# Patient Record
Sex: Female | Born: 1980 | Race: White | Hispanic: No | Marital: Single | Smoking: Current every day smoker
Health system: Southern US, Community
[De-identification: ages and names within clinical notes are randomized; demographics above are authoritative.]

## PROBLEM LIST (undated history)

## (undated) DIAGNOSIS — I219 Acute myocardial infarction, unspecified: Secondary | ICD-10-CM

## (undated) DIAGNOSIS — Z87440 Personal history of urinary (tract) infections: Secondary | ICD-10-CM

## (undated) DIAGNOSIS — K259 Gastric ulcer, unspecified as acute or chronic, without hemorrhage or perforation: Secondary | ICD-10-CM

## (undated) DIAGNOSIS — J449 Chronic obstructive pulmonary disease, unspecified: Secondary | ICD-10-CM

## (undated) HISTORY — PX: NO PAST SURGERIES: SHX2092

---

## 2005-04-26 ENCOUNTER — Emergency Department (HOSPITAL_COMMUNITY): Admission: EM | Admit: 2005-04-26 | Discharge: 2005-04-26 | Payer: Self-pay | Admitting: Emergency Medicine

## 2007-04-08 ENCOUNTER — Emergency Department (HOSPITAL_COMMUNITY): Admission: EM | Admit: 2007-04-08 | Discharge: 2007-04-09 | Payer: Self-pay | Admitting: Emergency Medicine

## 2007-09-26 ENCOUNTER — Emergency Department (HOSPITAL_COMMUNITY): Admission: EM | Admit: 2007-09-26 | Discharge: 2007-09-26 | Payer: Self-pay | Admitting: Emergency Medicine

## 2012-07-27 ENCOUNTER — Emergency Department: Payer: Self-pay | Admitting: Emergency Medicine

## 2012-11-14 ENCOUNTER — Emergency Department: Payer: Self-pay | Admitting: Emergency Medicine

## 2012-11-16 ENCOUNTER — Ambulatory Visit: Payer: Self-pay | Admitting: Family Medicine

## 2012-11-27 ENCOUNTER — Emergency Department: Payer: Self-pay | Admitting: Internal Medicine

## 2012-12-07 ENCOUNTER — Emergency Department: Payer: Self-pay | Admitting: Emergency Medicine

## 2013-10-13 ENCOUNTER — Emergency Department: Payer: Self-pay | Admitting: Emergency Medicine

## 2013-10-13 LAB — COMPREHENSIVE METABOLIC PANEL
ALBUMIN: 4.4 g/dL (ref 3.4–5.0)
ANION GAP: 10 (ref 7–16)
Alkaline Phosphatase: 63 U/L
BILIRUBIN TOTAL: 0.5 mg/dL (ref 0.2–1.0)
BUN: 13 mg/dL (ref 7–18)
CALCIUM: 9.2 mg/dL (ref 8.5–10.1)
CHLORIDE: 110 mmol/L — AB (ref 98–107)
Co2: 21 mmol/L (ref 21–32)
Creatinine: 0.89 mg/dL (ref 0.60–1.30)
EGFR (African American): >60
Glucose: 163 mg/dL — ABNORMAL HIGH (ref 65–99)
Osmolality: 285 (ref 275–301)
Potassium: 3.8 mmol/L (ref 3.5–5.1)
SGOT(AST): 21 U/L (ref 15–37)
SGPT (ALT): 25 U/L
SODIUM: 141 mmol/L (ref 136–145)
Total Protein: 8.2 g/dL (ref 6.4–8.2)

## 2013-10-13 LAB — CBC WITH DIFFERENTIAL/PLATELET
Basophil #: 0 10*3/uL (ref 0.0–0.1)
Basophil %: 0.3 %
Eosinophil #: 0 10*3/uL (ref 0.0–0.7)
Eosinophil %: 0.1 %
HCT: 47.1 % — ABNORMAL HIGH (ref 35.0–47.0)
HGB: 15.7 g/dL (ref 12.0–16.0)
LYMPHS PCT: 4.8 %
Lymphocyte #: 0.9 10*3/uL — ABNORMAL LOW (ref 1.0–3.6)
MCH: 34.9 pg — AB (ref 26.0–34.0)
MCHC: 33.4 g/dL (ref 32.0–36.0)
MCV: 105 fL — AB (ref 80–100)
MONO ABS: 0.5 x10 3/mm (ref 0.2–0.9)
Monocyte %: 3 %
Neutrophil #: 16.8 10*3/uL — ABNORMAL HIGH (ref 1.4–6.5)
Neutrophil %: 91.8 %
Platelet: 341 10*3/uL (ref 150–440)
RBC: 4.5 10*6/uL (ref 3.80–5.20)
RDW: 12.9 % (ref 11.5–14.5)
WBC: 18.3 10*3/uL — ABNORMAL HIGH (ref 3.6–11.0)

## 2013-10-13 LAB — TROPONIN I

## 2013-10-13 LAB — LIPASE, BLOOD: Lipase: 93 U/L (ref 73–393)

## 2013-10-14 LAB — HCG, QUANTITATIVE, PREGNANCY

## 2013-10-23 ENCOUNTER — Inpatient Hospital Stay: Payer: Self-pay | Admitting: Internal Medicine

## 2013-10-23 LAB — CBC
HCT: 39.8 % (ref 35.0–47.0)
HGB: 13.2 g/dL (ref 12.0–16.0)
MCH: 34.2 pg — ABNORMAL HIGH (ref 26.0–34.0)
MCHC: 33.2 g/dL (ref 32.0–36.0)
MCV: 103 fL — AB (ref 80–100)
PLATELETS: 225 10*3/uL (ref 150–440)
RBC: 3.86 10*6/uL (ref 3.80–5.20)
RDW: 12.4 % (ref 11.5–14.5)
WBC: 13.5 10*3/uL — AB (ref 3.6–11.0)

## 2013-10-23 LAB — URINALYSIS, COMPLETE
BILIRUBIN, UR: NEGATIVE
Glucose,UR: NEGATIVE mg/dL (ref 0–75)
KETONE: NEGATIVE
Nitrite: POSITIVE
PH: 5 (ref 4.5–8.0)
RBC,UR: 5 /HPF (ref 0–5)
Specific Gravity: 1.014 (ref 1.003–1.030)
Squamous Epithelial: 1
WBC UR: 287 /HPF (ref 0–5)

## 2013-10-23 LAB — LIPASE, BLOOD: LIPASE: 171 U/L (ref 73–393)

## 2013-10-23 LAB — COMPREHENSIVE METABOLIC PANEL
ALK PHOS: 87 U/L
Albumin: 3 g/dL — ABNORMAL LOW (ref 3.4–5.0)
Anion Gap: 11 (ref 7–16)
BUN: 10 mg/dL (ref 7–18)
Bilirubin,Total: 0.3 mg/dL (ref 0.2–1.0)
CALCIUM: 8.8 mg/dL (ref 8.5–10.1)
CHLORIDE: 93 mmol/L — AB (ref 98–107)
CREATININE: 0.97 mg/dL (ref 0.60–1.30)
Co2: 28 mmol/L (ref 21–32)
EGFR (African American): >60
EGFR (Non-African Amer.): >60
GLUCOSE: 107 mg/dL — AB (ref 65–99)
Osmolality: 264 (ref 275–301)
POTASSIUM: 3 mmol/L — AB (ref 3.5–5.1)
SGOT(AST): 20 U/L (ref 15–37)
SGPT (ALT): 21 U/L
Sodium: 132 mmol/L — ABNORMAL LOW (ref 136–145)
Total Protein: 7 g/dL (ref 6.4–8.2)

## 2013-10-23 LAB — HCG, QUANTITATIVE, PREGNANCY

## 2013-10-23 LAB — WET PREP, GENITAL

## 2013-10-23 LAB — GC/CHLAMYDIA PROBE AMP

## 2013-10-24 LAB — CBC WITH DIFFERENTIAL/PLATELET
BASOS ABS: 0 10*3/uL (ref 0.0–0.1)
BASOS PCT: 0.2 %
Eosinophil #: 0.1 10*3/uL (ref 0.0–0.7)
Eosinophil %: 0.6 %
HCT: 38.2 % (ref 35.0–47.0)
HGB: 12.5 g/dL (ref 12.0–16.0)
LYMPHS ABS: 1 10*3/uL (ref 1.0–3.6)
LYMPHS PCT: 6.9 %
MCH: 34.6 pg — ABNORMAL HIGH (ref 26.0–34.0)
MCHC: 32.8 g/dL (ref 32.0–36.0)
MCV: 105 fL — ABNORMAL HIGH (ref 80–100)
MONO ABS: 1.5 x10 3/mm — AB (ref 0.2–0.9)
Monocyte %: 10.1 %
NEUTROS PCT: 82.2 %
Neutrophil #: 11.9 10*3/uL — ABNORMAL HIGH (ref 1.4–6.5)
PLATELETS: 253 10*3/uL (ref 150–440)
RBC: 3.63 10*6/uL — AB (ref 3.80–5.20)
RDW: 12.6 % (ref 11.5–14.5)
WBC: 14.5 10*3/uL — ABNORMAL HIGH (ref 3.6–11.0)

## 2013-10-24 LAB — BASIC METABOLIC PANEL
ANION GAP: 10 (ref 7–16)
BUN: 7 mg/dL (ref 7–18)
CALCIUM: 7.6 mg/dL — AB (ref 8.5–10.1)
CREATININE: 0.81 mg/dL (ref 0.60–1.30)
Chloride: 99 mmol/L (ref 98–107)
Co2: 27 mmol/L (ref 21–32)
EGFR (Non-African Amer.): >60
Glucose: 113 mg/dL — ABNORMAL HIGH (ref 65–99)
OSMOLALITY: 271 (ref 275–301)
Potassium: 3.6 mmol/L (ref 3.5–5.1)
Sodium: 136 mmol/L (ref 136–145)

## 2013-10-25 LAB — URINE CULTURE

## 2013-10-28 LAB — CULTURE, BLOOD (SINGLE)

## 2014-07-05 NOTE — H&P (Signed)
PATIENT NAME:  Kayla Blanchard, Kayla Blanchard MR#:  [id number] DATE OF BIRTH:  12-01-1980  DATE OF ADMISSION:  10/23/2013  PRIMARY CARE PROVIDER: Meindert A. Brunetta Genera, MD.   CHIEF COMPLAINT: Right-sided flank pain.   REFERRING PHYSICIAN: Elta Guadeloupe R. Jacqualine Code, MD.   CHIEF COMPLAINT: Right-sided flank pain.   HISTORY OF PRESENT ILLNESS: The patient is a 34 year old white female with history of depression and hyperlipidemia who states that she came into the Emergency Room on Sunday with nausea, vomiting, diarrhea, and abdominal pain. At that time, she had a CT of the abdomen which did not show any acute abnormality. The patient was discharged home on antiemetics and dicyclomine for abdominal cramping. She reports that she continued to feel unwell and started having right-sided severe flank pain over the past few days. She also has had difficulty with urination and came to the ED and was noted to have likely pyelonephritis. She otherwise denies any fevers, chills. No chest pains. No shortness of breath. No palpitations.   PAST MEDICAL HISTORY: Significant for:  1. Depression.  2. Hyperlipidemia.   PAST SURGICAL HISTORY: None.   ALLERGIES: PENICILLIN, TRAMADOL, AND BANANAS.   MEDICATIONS:  Trazodone 50 at bedtime, sumatriptan 100 one tab p.o. daily as needed, simvastatin 20 daily at bedtime, promethazine 25 mg 1 to 2 tabs q. 4-6 p.r.n. for nausea and vomiting, Pristiq 50 at bedtime, clonazepam 0.5 one tab p.o. b.i.d.   SOCIAL HISTORY: Smokes half-pack per day. No alcohol or drug use.   FAMILY HISTORY: Positive for diabetes, hypertension.   REVIEW OF SYSTEMS:  CONSTITUTIONAL: Denies any fevers. Complains of fatigue, weakness, and pain. No weight loss. No weight gain.  EYES: No blurred or double vision. No pain. No redness. No inflammation. No glaucoma.  ENT: No tinnitus. No ear pain. No hearing loss. No seasonal or year-round allergies. No epistaxis.  RESPIRATORY: Denies any cough, wheezing, hemoptysis. No  COPD.  CARDIOVASCULAR: Denies any chest pain, orthopnea, edema, or arrhythmia.  GASTROINTESTINAL: Denies any nausea, vomiting or diarrhea-resolved. Complains of significant right flank pain. No IBS. No jaundice.  GENITOURINARY: Complains of difficulty with urination but no burning. Denies any polyuria, nocturia, thyroid problems.  HEMATOLOGIC AND LYMPHATIC: Denies anemia, easy bruisability or bleeding.  SKIN: No acne. No rash.  MUSCULOSKELETAL: Denies any pain in the neck, back or shoulder.  NEUROLOGIC: No numbness, CVA, TIA.  PSYCHIATRIC: Has a history of depression.   PHYSICAL EXAMINATION: VITAL SIGNS: Temperature 98.5, pulse 97, respirations 18, blood pressure 99/63, O2 100%.  GENERAL: The patient is a well-developed, well-nourished female in no acute distress.  HEENT: Head atraumatic, normocephalic. Pupils equally round, reactive to light and accommodation. There is no conjunctival pallor. No scleral icterus. Nasal exam shows no drainage or ulceration.  OROPHARYNX: Clear without any exudate.  NECK: Supple without any JVD.  CARDIOVASCULAR: Regular rate and rhythm. No murmurs, rubs, clicks, or gallops.  LUNGS: Clear to auscultation bilaterally without any rales, rhonchi, wheezing.  ABDOMEN: Soft, nontender, nondistended. Positive bowel sounds x 4. She has a right flank tenderness.  SKIN: No rash.  LYMPHATICS: No lymph nodes palpable.  VASCULAR: Good DP, PT pulses.  PSYCHIATRIC: Not anxious or depressed.  NEUROLOGIC: Awake, alert, oriented x 3. No focal deficits.   EVALUATIONS: Glucose 107, BUN 10, creatinine 0.97, sodium 132, potassium 3.0, chloride 93, CO2 28. Lipase is 171. LFTs: Total protein 7.0, albumin 3.0, bilirubin total 0.3, alkaline phosphatase 87, AST 20, ALT 21. WBC 13.5, hemoglobin 13.2, platelet count 225,000. Urinalysis is blood 2+, leukocytes 3+,  WBCs 287, bacteria 3+.   ASSESSMENT AND PLAN: The patient is a 34 year old white female who presents with a significant  right-sided flank pain.  1. Severe flank pain due to acute right-sided pyelonephritis  with signs present consistent with systemic inflammatory response syndrome with tachycardia and elevated WBC count. At this time, we will treat her with intravenous Levaquin. Follow blood cultures and urine cultures.  2. Dehydration. We will give her intravenous fluids.  3. Hypokalemia: We will replace her potassium.  4. Depression. Continue trazodone and clonazepam.  5. Miscellaneous. The patient will be on Lovenox for deep vein thrombosis prophylaxis.  6. Nicotine addiction. The patient counseled regarding smoking cessation; 4 minutes spent. She will be started on a nicotine patch. I strongly recommended that she stop smoking.  Total of 50 minutes spent on this H and P.   ____________________________ Lafonda Mosses. Posey Pronto, MD shp:NT D: 10/23/2013 15:21:48 ET T: 10/23/2013 15:40:36 ET JOB#: [id number]  cc: Ethelyne Erich H. Posey Pronto, MD, <Dictator> Alric Seton MD ELECTRONICALLY SIGNED 10/29/2013 14:19

## 2014-07-05 NOTE — Discharge Summary (Signed)
PATIENT NAME:  Kayla Blanchard, Kayla Blanchard MR#:  [id number] DATE OF BIRTH:  July 04, 1980  DATE OF ADMISSION:  10/23/2013 DATE OF DISCHARGE:  10/24/2013  ADMISSION DIAGNOSIS:  Acute pyelonephritis.  DISCHARGE DIAGNOSES: 1.  Acute pyelonephritis. 2.  Anxiety and depression.  3.  Urine culture positive for gram-negative rods.   IMAGING:  CT of the abdomen shows acute pyelonephritis.   LABORATORY DATA: Discharge white blood cells 14, hemoglobin 12, hematocrit 38, platelets 253,000 . Sodium 136, potassium 3.6, chloride 99, bicarbonate 27, BUN 7, creatinine 0.81, glucose is 113.  Blood cultures negative to date.   ULTRASOUND:  Pelvic transvaginal showed no evidence of any cysts.   HOSPITAL COURSE: A 34 year old female who presented with right-sided pain and fever and was found to have acute pyelonephritis. For further details, please refer to the H and P.  ASSESSMENT AND PLAN:  1.  Acute right-sided pyelonephritis. The patient had right CVA tenderness, urinalysis which was positive for UTI.  CT of the abdomen looking for a kidney stone actually was negative for a kidney stone but did show evidence of acute pyelonephritis. 2.  Anxiety and depression. The patient was to continue on outpatient medications.  3.  Tobacco dependence. The patient was counseled regarding stopping smoking and was discharged on a nicotine patch.   ALLERGIES:  PENICILLIN and is being discharged on ciprofloxacin and Bactrim.   DISCHARGE MEDICATIONS:  1.  Simvastatin 20 mg daily.  2.  Pristiq 50 mg at bedtime.  3.  Sumatriptan 100 mg daily p.r.n.  4.  Clonazepam 0.5 mg b.i.d.  5.  Trazodone 50 mg daily.  6.  Promethazine 25 mg 1 to 2 tablets q. 4-6 hours p.r.n. pain, nausea or vomiting. 7.  Acetaminophen 325/oxycodone 5 q. 4 hours p.r.n. pain.  8.  Nicotine patch 21 mg per 24 hours.  9.  Ciprofloxacin 500 mg p.o. q. 12 hours x 12 days.  10.  Bactrim 1 tablet p.o. b.i.d. x 12 days.   DISCHARGE DIET: Regular diet.    DISCHARGE ACTIVITY: As tolerated.  DISCHARGE FOLLOWUP:  The patient will follow up with Dr. Brunetta Genera in 1 week.  She sees Dr. Brunetta Genera.   TIME SPENT: Approximately 35 minutes.  DISPOSITION:  The patient was stable for discharge.    ____________________________ Davan Nawabi P. Benjie Karvonen, MD spm:DT D: 10/24/2013 12:53:39 ET T: 10/24/2013 14:25:51 ET JOB#: [id number]  cc: Asako Saliba P. Benjie Karvonen, MD, <Dictator> Donell Beers Angelyn Osterberg MD ELECTRONICALLY SIGNED 10/25/2013 14:04

## 2015-08-06 ENCOUNTER — Emergency Department
Admission: EM | Admit: 2015-08-06 | Discharge: 2015-08-06 | Disposition: A | Discharge status: Home / Self Care | Payer: Medicaid Other | Attending: Emergency Medicine | Admitting: Emergency Medicine

## 2015-08-06 ENCOUNTER — Encounter: Payer: Self-pay | Admitting: Emergency Medicine

## 2015-08-06 DIAGNOSIS — L0231 Cutaneous abscess of buttock: Secondary | ICD-10-CM | POA: Insufficient documentation

## 2015-08-06 DIAGNOSIS — Y929 Unspecified place or not applicable: Secondary | ICD-10-CM | POA: Insufficient documentation

## 2015-08-06 DIAGNOSIS — W57XXXA Bitten or stung by nonvenomous insect and other nonvenomous arthropods, initial encounter: Secondary | ICD-10-CM | POA: Insufficient documentation

## 2015-08-06 DIAGNOSIS — F17210 Nicotine dependence, cigarettes, uncomplicated: Secondary | ICD-10-CM | POA: Insufficient documentation

## 2015-08-06 DIAGNOSIS — Y999 Unspecified external cause status: Secondary | ICD-10-CM | POA: Diagnosis not present

## 2015-08-06 DIAGNOSIS — Y939 Activity, unspecified: Secondary | ICD-10-CM | POA: Insufficient documentation

## 2015-08-06 DIAGNOSIS — L0390 Cellulitis, unspecified: Secondary | ICD-10-CM

## 2015-08-06 DIAGNOSIS — L03317 Cellulitis of buttock: Secondary | ICD-10-CM | POA: Diagnosis not present

## 2015-08-06 DIAGNOSIS — S30860A Insect bite (nonvenomous) of lower back and pelvis, initial encounter: Secondary | ICD-10-CM | POA: Diagnosis present

## 2015-08-06 DIAGNOSIS — L0291 Cutaneous abscess, unspecified: Secondary | ICD-10-CM

## 2015-08-06 MED ORDER — SULFAMETHOXAZOLE-TRIMETHOPRIM 800-160 MG PO TABS
1.0000 | ORAL_TABLET | Freq: Two times a day (BID) | ORAL | Status: DC
Start: 2015-08-06 — End: 2016-01-11

## 2015-08-06 MED ORDER — SULFAMETHOXAZOLE-TRIMETHOPRIM 800-160 MG PO TABS
1.0000 | ORAL_TABLET | Freq: Once | ORAL | Status: AC
  Administered 2015-08-06: 1 via ORAL
  Filled 2015-08-06: qty 1

## 2015-08-06 NOTE — ED Notes (Signed)
Patient comes to ER with apparent bite of some sort to left buttock.  Area is red, raised, and tender to palpation.  Patient noticed the bite this morning as she was preparing for work.  No weeping from site noted.

## 2015-08-06 NOTE — Discharge Instructions (Signed)
Cellulitis Cellulitis is an infection of the skin and the tissue beneath it. The infected area is usually red and tender. Cellulitis occurs most often in the arms and lower legs.  CAUSES  Cellulitis is caused by bacteria that enter the skin through cracks or cuts in the skin. The most common types of bacteria that cause cellulitis are staphylococci and streptococci. SIGNS AND SYMPTOMS   Redness and warmth.  Swelling.  Tenderness or pain.  Fever. DIAGNOSIS  Your health care provider can usually determine what is wrong based on a physical exam. Blood tests may also be done. TREATMENT  Treatment usually involves taking an antibiotic medicine. HOME CARE INSTRUCTIONS   Take your antibiotic medicine as directed by your health care provider. Finish the antibiotic even if you start to feel better.  Keep the infected arm or leg elevated to reduce swelling.  Apply a warm cloth to the affected area up to 4 times per day to relieve pain.  Take medicines only as directed by your health care provider.  Keep all follow-up visits as directed by your health care provider. SEEK MEDICAL CARE IF:   You notice red streaks coming from the infected area.  Your red area gets larger or turns dark in color.  Your bone or joint underneath the infected area becomes painful after the skin has healed.  Your infection returns in the same area or another area.  You notice a swollen bump in the infected area.  You develop new symptoms.  You have a fever. SEEK IMMEDIATE MEDICAL CARE IF:   You feel very sleepy.  You develop vomiting or diarrhea.  You have a general ill feeling (malaise) with muscle aches and pains.   This information is not intended to replace advice given to you by your health care provider. Make sure you discuss any questions you have with your health care provider.   Document Released: 12/08/2004 Document Revised: 11/19/2014 Document Reviewed: 05/16/2011 Elsevier Interactive  Patient Education 2016 [place] have some redness or cellulitis surrounding that larger pimple. The Bactrim antibiotic should take care of it. Please return if it gets worse or is not better in about 2 days. Follow avoid sitting on it. You can try some soaks with Epsom salts or something similar that may help as well.

## 2015-08-06 NOTE — ED Provider Notes (Signed)
[facility] Emergency Department Provider Note   ____________________________________________  Time seen: Approximately 7:37 AM  I have reviewed the triage vital signs and the nursing notes.   HISTORY  Chief Complaint Insect Bite    HPI Kayla Blanchard is a 35 y.o. female who noticed a redness tender spot on her buttocks this morning. She came in because she thought was an insect bite. As about the size of a quarter all told tender slightly warm. She has not had one before.   History reviewed. No pertinent past medical history.  There are no active problems to display for this patient.   History reviewed. No pertinent past surgical history.  Current Outpatient Rx  Name  Route  Sig  Dispense  Refill  . sulfamethoxazole-trimethoprim (BACTRIM DS,SEPTRA DS) 800-160 MG tablet   Oral   Take 1 tablet by mouth 2 (two) times daily.   14 tablet   0     Allergies Amoxicillin and Penicillins  No family history on file.  Social History Social History  Substance Use Topics  . Smoking status: Current Every Day Smoker -- 1.00 packs/day    Types: Cigarettes  . Smokeless tobacco: None  . Alcohol Use: No    Review of Systems Constitutional: No fever/chills Eyes: No visual changes. ENT: No sore throat. Cardiovascular: Denies chest pain. Respiratory: Denies shortness of breath. Gastrointestinal: No abdominal pain.  No nausea, no vomiting.  No diarrhea.  No constipation. Genitourinary: Negative for dysuria. Musculoskeletal: Negative for back pain. Skin: Negative for rash Except for pinpoint buttocks. Neurological: Negative for headaches, focal weakness or numbness.  10-point ROS otherwise negative.  ____________________________________________   PHYSICAL EXAM:  VITAL SIGNS: ED Triage Vitals  Enc Vitals Group     BP 08/06/15 0616 133/84 mmHg     Pulse Rate 08/06/15 0616 86     Resp 08/06/15 0616 18     Temp 08/06/15 0616 98 F (36.7  C)     Temp Source 08/06/15 0616 Oral     SpO2 08/06/15 0616 100 %     Weight 08/06/15 0616 110 lb (49.896 kg)     Height 08/06/15 0616 5\' 2"  (1.575 m)     Head Cir --      Peak Flow --      Pain Score 08/06/15 0615 8     Pain Loc --      Pain Edu? --      Excl. in [place]? --     Constitutional: Alert and oriented. Well appearing and in no acute distress. Eyes: Conjunctivae are normal. PERRL. EOMI. Head: Atraumatic. Nose: No congestion/rhinnorhea. Mouth/Throat: Mucous membranes are moist.  Oropharynx non-erythematous. Neck: No stridor.  Cardiovascular: Good peripheral circulation. Respiratory: Normal respiratory effort.  No retractions. Lungs CTAB. Musculoskeletal: No lower extremity tenderness nor edema.  No joint effusions. Neurologic:  Normal speech and language. No gross focal neurologic deficits are appreciated. No gait instability. Skin:  Skin is warm, dry and intact. No rash noted.Except for apparent whitehead on buttocks. It is approximately 3 mm in diameter and 2 mm high. Surrounded by quarter size area of erythema. This was cleaned with alcohol and anesthetized with the skin freezing liquid and then poked with a #11 scalpel twice unfortunately given the blood was drawn and none of what appeared to be the whitehead came out. Procedure was repeated with the same results. Apparently there is not any pus in there at this point. Patient will be given prescription for Bactrim given  one here she is not pregnant she is sure she is also aware of the risk of birth defects with the Bactrim patient will come back if she is worse or no better in 2 or 3 days   ____________________________________________   LABS (all labs ordered are listed, but only abnormal results are displayed)  Labs Reviewed - No data to  display ____________________________________________  EKG   ____________________________________________  RADIOLOGY   ____________________________________________   PROCEDURES    ____________________________________________   INITIAL IMPRESSION / ASSESSMENT AND PLAN / ED COURSE  Pertinent labs & imaging results that were available during my care of the patient were reviewed by me and considered in my medical decision making (see chart for details).   ____________________________________________   FINAL CLINICAL IMPRESSION(S) / ED DIAGNOSES  Final diagnoses:  Cellulitis and abscess   Real diagnosis is painful with a small amount of surrounding cellulitis   NEW MEDICATIONS STARTED DURING THIS VISIT:  New Prescriptions   SULFAMETHOXAZOLE-TRIMETHOPRIM (BACTRIM DS,SEPTRA DS) 800-160 MG TABLET    Take 1 tablet by mouth 2 (two) times daily.     Note:  This document was prepared using Dragon voice recognition software and may include unintentional dictation errors.    Nena Polio, MD 08/06/15 [id number]

## 2015-08-06 NOTE — ED Notes (Signed)
Patient ambulatory to triage with steady gait, without difficulty or distress noted; pt reports ?spider bite to left buttock

## 2016-01-11 ENCOUNTER — Emergency Department: Payer: BLUE CROSS/BLUE SHIELD

## 2016-01-11 ENCOUNTER — Emergency Department
Admission: EM | Admit: 2016-01-11 | Discharge: 2016-01-11 | Disposition: A | Discharge status: Home / Self Care | Payer: BLUE CROSS/BLUE SHIELD | Attending: Emergency Medicine | Admitting: Emergency Medicine

## 2016-01-11 DIAGNOSIS — F17210 Nicotine dependence, cigarettes, uncomplicated: Secondary | ICD-10-CM | POA: Insufficient documentation

## 2016-01-11 DIAGNOSIS — J441 Chronic obstructive pulmonary disease with (acute) exacerbation: Secondary | ICD-10-CM | POA: Diagnosis not present

## 2016-01-11 DIAGNOSIS — R0789 Other chest pain: Secondary | ICD-10-CM

## 2016-01-11 DIAGNOSIS — R0602 Shortness of breath: Secondary | ICD-10-CM | POA: Diagnosis present

## 2016-01-11 LAB — BASIC METABOLIC PANEL
Anion gap: 11 (ref 5–15)
BUN: 16 mg/dL (ref 6–20)
CALCIUM: 9.4 mg/dL (ref 8.9–10.3)
CO2: 24 mmol/L (ref 22–32)
CREATININE: 0.78 mg/dL (ref 0.44–1.00)
Chloride: 98 mmol/L — ABNORMAL LOW (ref 101–111)
GFR calc non Af Amer: >60 mL/min (ref >60)
GLUCOSE: 106 mg/dL — AB (ref 65–99)
Potassium: 3.9 mmol/L (ref 3.5–5.1)
Sodium: 133 mmol/L — ABNORMAL LOW (ref 135–145)

## 2016-01-11 LAB — CBC
HCT: 41.6 % (ref 35.0–47.0)
Hemoglobin: 14.4 g/dL (ref 12.0–16.0)
MCH: 34.7 pg — AB (ref 26.0–34.0)
MCHC: 34.6 g/dL (ref 32.0–36.0)
MCV: 100.4 fL — ABNORMAL HIGH (ref 80.0–100.0)
PLATELETS: 293 10*3/uL (ref 150–440)
RBC: 4.14 MIL/uL (ref 3.80–5.20)
RDW: 13 % (ref 11.5–14.5)
WBC: 8.5 10*3/uL (ref 3.6–11.0)

## 2016-01-11 LAB — TROPONIN I

## 2016-01-11 MED ORDER — IPRATROPIUM BROMIDE 0.02 % IN SOLN
0.5000 mg | Freq: Once | RESPIRATORY_TRACT | Status: AC
  Administered 2016-01-11: 0.5 mg via RESPIRATORY_TRACT
  Filled 2016-01-11: qty 2.5

## 2016-01-11 MED ORDER — IBUPROFEN 600 MG PO TABS: 600.0000 mg | ORAL_TABLET | Freq: Four times a day (QID) | ORAL | 0 refills | Status: DC | PRN

## 2016-01-11 MED ORDER — ALBUTEROL SULFATE (2.5 MG/3ML) 0.083% IN NEBU
5.0000 mg | INHALATION_SOLUTION | Freq: Once | RESPIRATORY_TRACT | Status: AC
  Administered 2016-01-11: 5 mg via RESPIRATORY_TRACT
  Filled 2016-01-11: qty 6

## 2016-01-11 MED ORDER — KETOROLAC TROMETHAMINE 30 MG/ML IJ SOLN
30.0000 mg | Freq: Once | INTRAMUSCULAR | Status: AC
  Administered 2016-01-11: 30 mg via INTRAVENOUS
  Filled 2016-01-11: qty 1

## 2016-01-11 MED ORDER — CYCLOBENZAPRINE HCL 5 MG PO TABS: 5.0000 mg | ORAL_TABLET | Freq: Three times a day (TID) | ORAL | 0 refills | Status: DC | PRN

## 2016-01-11 MED ORDER — PREDNISONE 20 MG PO TABS: ORAL_TABLET | ORAL | 0 refills | Status: DC

## 2016-01-11 MED ORDER — METHYLPREDNISOLONE SODIUM SUCC 125 MG IJ SOLR
125.0000 mg | Freq: Once | INTRAMUSCULAR | Status: AC
  Administered 2016-01-11: 125 mg via INTRAVENOUS
  Filled 2016-01-11: qty 2

## 2016-01-11 MED ORDER — CYCLOBENZAPRINE HCL 10 MG PO TABS
5.0000 mg | ORAL_TABLET | Freq: Once | ORAL | Status: AC
  Administered 2016-01-11: 5 mg via ORAL
  Filled 2016-01-11: qty 2

## 2016-01-11 MED ORDER — SODIUM CHLORIDE 0.9 % IV BOLUS (SEPSIS)
1000.0000 mL | Freq: Once | INTRAVENOUS | Status: AC
  Administered 2016-01-11: 1000 mL via INTRAVENOUS

## 2016-01-11 NOTE — Discharge Instructions (Signed)
Take motrin for pain.   Take flexeril for muscle spasms.   Take prednisone as prescribed.   Use albuterol every 4 hrs as needed for cough.   See your doctor.   Stop smoking   Return to ER if you have worse cough, shortness of breath, chest pain, fever

## 2016-01-11 NOTE — ED Provider Notes (Addendum)
[facility] Provider Note   CSN: [id number] Arrival date & time: 01/11/16  1447     History   Chief Complaint Chief Complaint  Patient presents with  . Chest Pain    HPI Kayla Blanchard is a 35 y.o. female hx of COPD, current smoker here with SOB, chest pain. Substernal chest pain for the last 3-4 days. Patient states that it is sense of pressure initially told triage that elephant sitting on her chest but told me that it is intermittent. Is actually worse when she takes a deep breath but denies any shortness of breath. Patient states that it hurts when she moves as well. Denies any fever. Has been having nonproductive cough. Patient still smokes about 1 pack per day.    The history is provided by the patient.    History reviewed. No pertinent past medical history.  There are no active problems to display for this patient.   History reviewed. No pertinent surgical history.  OB History    No data available       Home Medications    Prior to Admission medications   Medication Sig Start Date End Date Taking? Authorizing Provider  PROAIR RESPICLICK [id number] (90 Base) MCG/ACT AEPB Inhale 2 puffs into the lungs every 6 (six) hours as needed. 12/16/15  Yes Historical Provider, MD    Family History No family history on file.  Social History Social History  Substance Use Topics  . Smoking status: Current Every Day Smoker    Packs/day: 1.00    Types: Cigarettes  . Smokeless tobacco: Never Used  . Alcohol use Yes     Comment: occasional     Allergies   Amoxicillin and Penicillins   Review of Systems Review of Systems  Cardiovascular: Positive for chest pain.  All other systems reviewed and are negative.    Physical Exam Updated Vital Signs BP 135/87 (BP Location: Left Arm)   Pulse 87   Temp 97.8 F (36.6 C) (Oral)   Resp 16   Ht 5\' 1"  (1.549 m)   Wt 105 lb (47.6 kg)   LMP 01/11/2016   SpO2 100%   BMI 19.84 kg/m   Physical Exam    Constitutional: She is oriented to person, place, and time.  Slightly uncomfortable   HENT:  Head: Normocephalic.  Mouth/Throat: Oropharynx is clear and moist.  Eyes: EOM are normal. Pupils are equal, round, and reactive to light.  Neck: Normal range of motion. Neck supple.  Cardiovascular: Normal rate, regular rhythm and normal heart sounds.   Pulmonary/Chest: Effort normal and breath sounds normal.  + reproducible chest wall tenderness. Diminished breath sounds bilateral bases, no obvious wheezing   Abdominal: Soft. Bowel sounds are normal. She exhibits no distension. There is no tenderness. There is no guarding.  Musculoskeletal: Normal range of motion.  Neurological: She is alert and oriented to person, place, and time. No cranial nerve deficit. Coordination normal.  Skin: Skin is warm.  Psychiatric: She has a normal mood and affect.  Nursing note and vitals reviewed.    ED Treatments / Results  Labs (all labs ordered are listed, but only abnormal results are displayed) Labs Reviewed  BASIC METABOLIC PANEL - Abnormal; Notable for the following:       Result Value   Sodium 133 (*)    Chloride 98 (*)    Glucose, Bld 106 (*)    All other components within normal limits  CBC - Abnormal; Notable for the following:  MCV 100.4 (*)    MCH 34.7 (*)    All other components within normal limits  TROPONIN I    EKG  EKG Interpretation None      ED ECG REPORT I, Wandra Arthurs, the attending physician, personally viewed and interpreted this ECG.   Date: 01/11/2016  EKG Time: 14:51 pm  Rate: 90  Rhythm: normal EKG, normal sinus rhythm  Axis: right  Intervals:none  ST&T Change: normal   Radiology Dg Chest 2 View  Result Date: 01/11/2016 CLINICAL DATA:  Four days of chest pain with shortness of breath. Patient has a history of COPD and is a current smoker EXAM: CHEST  2 VIEW COMPARISON:  Report of a chest x-ray dated May 25 1998 FINDINGS: The lungs are hyperinflated  with mild hemidiaphragm flattening. There is no focal infiltrate. There is no pleural effusion. Bilateral nipple shadows are evident. The heart and pulmonary vascularity are normal. The mediastinum is normal in width. There is no pneumothorax or pneumomediastinum. The bony thorax is unremarkable. IMPRESSION: COPD. There is no pneumonia, CHF, nor other acute cardiopulmonary abnormality. Electronically Signed   By: David  Martinique M.D.   On: 01/11/2016 15:24    Procedures Procedures (including critical care time)  Medications Ordered in ED Medications  ketorolac (TORADOL) 30 MG/ML injection 30 mg (30 mg Intravenous Given 01/11/16 1649)  cyclobenzaprine (FLEXERIL) tablet 5 mg (5 mg Oral Given 01/11/16 1651)  methylPREDNISolone sodium succinate (SOLU-MEDROL) 125 mg/2 mL injection 125 mg (125 mg Intravenous Given 01/11/16 1648)  albuterol (PROVENTIL) (2.5 MG/3ML) 0.083% nebulizer solution 5 mg (5 mg Nebulization Given 01/11/16 1648)  ipratropium (ATROVENT) nebulizer solution 0.5 mg (0.5 mg Nebulization Given 01/11/16 1649)  sodium chloride 0.9 % bolus 1,000 mL (1,000 mLs Intravenous New Bag/Given 01/11/16 1649)     Initial Impression / Assessment and Plan / ED Course  I have reviewed the triage vital signs and the nursing notes.  Pertinent labs & imaging results that were available during my care of the patient were reviewed by me and considered in my medical decision making (see chart for details).  Clinical Course    Kayla Blanchard is a 35 y.o. female here with chest pain. Chest pain reproducible, diminished breath sounds bilaterally. I think likely muscle strain from COPD exacerbation. Not hypoxic or tachycardic, I doubt PE. Will get labs, Trop x 1, CXR. Will give toradol, flexeril, steroids, nebs.   6:15 PM Labs unremarkable. Trop neg x 1. CXR showed COPD. Given meds and felt better. Never hypoxic. Will dc home with flexeril, motrin, prednisone. Has albuterol at home. Recommend smoking  cessation    Final Clinical Impressions(s) / ED Diagnoses   Final diagnoses:  None    New Prescriptions New Prescriptions   No medications on file     Drenda Freeze, MD 01/11/16 [place] Yao, MD 01/11/16 [id number]

## 2016-01-11 NOTE — ED Triage Notes (Signed)
Pt c/o chest pain that started a few days ago it is throughout the chest and feels as an elephant is sitting on her chest.

## 2016-09-18 ENCOUNTER — Emergency Department
Admission: EM | Admit: 2016-09-18 | Discharge: 2016-09-18 | Disposition: A | Discharge status: Home / Self Care | Payer: BLUE CROSS/BLUE SHIELD | Attending: Emergency Medicine | Admitting: Emergency Medicine

## 2016-09-18 ENCOUNTER — Emergency Department: Payer: BLUE CROSS/BLUE SHIELD

## 2016-09-18 ENCOUNTER — Encounter: Payer: Self-pay | Admitting: Emergency Medicine

## 2016-09-18 DIAGNOSIS — N10 Acute tubulo-interstitial nephritis: Secondary | ICD-10-CM | POA: Insufficient documentation

## 2016-09-18 DIAGNOSIS — F17210 Nicotine dependence, cigarettes, uncomplicated: Secondary | ICD-10-CM | POA: Diagnosis not present

## 2016-09-18 DIAGNOSIS — R109 Unspecified abdominal pain: Secondary | ICD-10-CM | POA: Diagnosis present

## 2016-09-18 DIAGNOSIS — J449 Chronic obstructive pulmonary disease, unspecified: Secondary | ICD-10-CM | POA: Diagnosis not present

## 2016-09-18 HISTORY — DX: Personal history of urinary (tract) infections: Z87.440

## 2016-09-18 HISTORY — DX: Chronic obstructive pulmonary disease, unspecified: J44.9

## 2016-09-18 LAB — CBC
HEMATOCRIT: 43 % (ref 35.0–47.0)
HEMOGLOBIN: 14.9 g/dL (ref 12.0–16.0)
MCH: 34.1 pg — ABNORMAL HIGH (ref 26.0–34.0)
MCHC: 34.7 g/dL (ref 32.0–36.0)
MCV: 98.2 fL (ref 80.0–100.0)
Platelets: 298 10*3/uL (ref 150–440)
RBC: 4.38 MIL/uL (ref 3.80–5.20)
RDW: 12.8 % (ref 11.5–14.5)
WBC: 12.5 10*3/uL — AB (ref 3.6–11.0)

## 2016-09-18 LAB — COMPREHENSIVE METABOLIC PANEL
ALT: 15 U/L (ref 14–54)
ANION GAP: 9 (ref 5–15)
AST: 17 U/L (ref 15–41)
Albumin: 4.5 g/dL (ref 3.5–5.0)
Alkaline Phosphatase: 65 U/L (ref 38–126)
BILIRUBIN TOTAL: 0.6 mg/dL (ref 0.3–1.2)
BUN: 10 mg/dL (ref 6–20)
CHLORIDE: 103 mmol/L (ref 101–111)
CO2: 25 mmol/L (ref 22–32)
Calcium: 9.2 mg/dL (ref 8.9–10.3)
Creatinine, Ser: 0.92 mg/dL (ref 0.44–1.00)
GFR calc Af Amer: >60 mL/min (ref >60)
Glucose, Bld: 158 mg/dL — ABNORMAL HIGH (ref 65–99)
POTASSIUM: 4 mmol/L (ref 3.5–5.1)
Sodium: 137 mmol/L (ref 135–145)
TOTAL PROTEIN: 7.8 g/dL (ref 6.5–8.1)

## 2016-09-18 LAB — URINALYSIS, COMPLETE (UACMP) WITH MICROSCOPIC
BILIRUBIN URINE: NEGATIVE
GLUCOSE, UA: NEGATIVE mg/dL
Ketones, ur: 5 mg/dL — AB
Nitrite: POSITIVE — AB
PROTEIN: 30 mg/dL — AB
Specific Gravity, Urine: 1.014 (ref 1.005–1.030)
pH: 5 (ref 5.0–8.0)

## 2016-09-18 LAB — POCT PREGNANCY, URINE: Preg Test, Ur: NEGATIVE

## 2016-09-18 LAB — LIPASE, BLOOD: LIPASE: 87 U/L — AB (ref 11–51)

## 2016-09-18 MED ORDER — LEVOFLOXACIN 750 MG PO TABS: 750.0000 mg | ORAL_TABLET | Freq: Every day | ORAL | 0 refills | Status: AC

## 2016-09-18 MED ORDER — LEVOFLOXACIN IN D5W 750 MG/150ML IV SOLN
750.0000 mg | Freq: Once | INTRAVENOUS | Status: AC
  Administered 2016-09-18: 750 mg via INTRAVENOUS
  Filled 2016-09-18: qty 150

## 2016-09-18 MED ORDER — SODIUM CHLORIDE 0.9 % IV BOLUS (SEPSIS)
1000.0000 mL | Freq: Once | INTRAVENOUS | Status: AC
  Administered 2016-09-18: 1000 mL via INTRAVENOUS

## 2016-09-18 MED ORDER — IOPAMIDOL (ISOVUE-300) INJECTION 61%
75.0000 mL | Freq: Once | INTRAVENOUS | Status: AC | PRN
  Administered 2016-09-18: 75 mL via INTRAVENOUS
  Filled 2016-09-18: qty 75

## 2016-09-18 MED ORDER — HYDROCODONE-ACETAMINOPHEN 5-325 MG PO TABS: 1.0000 | ORAL_TABLET | ORAL | 0 refills | Status: AC | PRN

## 2016-09-18 MED ORDER — ONDANSETRON 4 MG PO TBDP: 4.0000 mg | ORAL_TABLET | Freq: Three times a day (TID) | ORAL | 0 refills | Status: DC | PRN

## 2016-09-18 MED ORDER — MORPHINE SULFATE (PF) 4 MG/ML IV SOLN
4.0000 mg | Freq: Once | INTRAVENOUS | Status: AC
  Administered 2016-09-18: 4 mg via INTRAVENOUS
  Filled 2016-09-18: qty 1

## 2016-09-18 MED ORDER — ONDANSETRON 4 MG PO TBDP
4.0000 mg | ORAL_TABLET | Freq: Once | ORAL | Status: AC | PRN
  Administered 2016-09-18: 4 mg via ORAL
  Filled 2016-09-18: qty 1

## 2016-09-18 NOTE — ED Provider Notes (Signed)
[facility] Emergency Department Provider Note  ____________________________________________   First MD Initiated Contact with Patient 09/18/16 1505     (approximate)  I have reviewed the triage vital signs and the nursing notes.   HISTORY  Chief Complaint Flank Pain; Fever; and Emesis  HPI Kayla Blanchard is a 36 y.o. female who presents to the emergency department for evaluation of right flank pain, fever, urinary frequency, dysuria, and vomiting. Flank pain began about a week ago. She has a history of kidney infection 3-4 years ago and was hospitalized "for a week." Current symptoms are similar. No UTI or kidney infections since that time. No alleviating measures have been taken for this complaint.  Past Medical History:  Diagnosis Date  . COPD (chronic obstructive pulmonary disease) ([place])   . History of kidney infection     There are no active problems to display for this patient.   History reviewed. No pertinent surgical history.  Prior to Admission medications   Medication Sig Start Date End Date Taking? Authorizing Provider  cyclobenzaprine (FLEXERIL) 5 MG tablet Take 1 tablet (5 mg total) by mouth 3 (three) times daily as needed for muscle spasms. 01/11/16   Drenda Freeze, MD  HYDROcodone-acetaminophen (NORCO/VICODIN) 5-325 MG tablet Take 1 tablet by mouth every 4 (four) hours as needed for moderate pain. 09/18/16 09/18/17  Shweta Aman, Johnette Abraham B, FNP  ibuprofen (ADVIL,MOTRIN) 600 MG tablet Take 1 tablet (600 mg total) by mouth every 6 (six) hours as needed. 01/11/16   Drenda Freeze, MD  levofloxacin (LEVAQUIN) 750 MG tablet Take 1 tablet (750 mg total) by mouth daily. 09/18/16 09/28/16  Malaquias Lenker, Johnette Abraham B, FNP  ondansetron (ZOFRAN-ODT) 4 MG disintegrating tablet Take 1 tablet (4 mg total) by mouth every 8 (eight) hours as needed for nausea or vomiting. 09/18/16   Shade Rivenbark, Johnette Abraham B, FNP  predniSONE (DELTASONE) 20 MG tablet Take 60 mg daily x 2 days  then 40 mg daily x 2 days then 20 mg daily x 2 days 01/11/16   Drenda Freeze, MD  [facility] [address] [phone] Base) MCG/ACT AEPB Inhale 2 puffs into the lungs every 6 (six) hours as needed. 12/16/15   [provider]    Allergies Amoxicillin and Penicillins  No family history on file.  Social History Social History  Substance Use Topics  . Smoking status: Current Every Day Smoker    Packs/day: 1.00    Types: Cigarettes  . Smokeless tobacco: Never Used  . Alcohol use Yes     Comment: occasional    Review of Systems  Constitutional: No fever/chills Eyes: No visual changes. ENT: No sore throat. Cardiovascular: Denies chest pain. Respiratory: Denies shortness of breath. Gastrointestinal: No abdominal pain.  No nausea, no vomiting.  No diarrhea.  No constipation. Genitourinary: Negative for dysuria. Musculoskeletal: Negative for back pain. Skin: Negative for rash. Neurological: Negative for headaches, focal weakness or numbness. ____________________________________________  PHYSICAL EXAM:  VITAL SIGNS: ED Triage Vitals  Enc Vitals Group     BP 09/18/16 1319 131/79     Pulse Rate 09/18/16 1319 (!) 111     Resp 09/18/16 1319 18     Temp 09/18/16 1319 99.3 F (37.4 C)     Temp Source 09/18/16 1319 Oral     SpO2 09/18/16 1319 99 %     Weight 09/18/16 1320 105 lb (47.6 kg)     Height 09/18/16 1320 5\' 2"  (1.575 m)     Head Circumference --  Peak Flow --      Pain Score 09/18/16 1319 10     Pain Loc --      Pain Edu? --      Excl. in [place]? --     Constitutional: Alert and oriented. Well appearing and in no acute distress. Eyes: Conjunctivae are normal. PERRL. EOMI. Head: Atraumatic. Nose: No congestion/rhinnorhea. Mouth/Throat: Mucous membranes are moist.  Oropharynx non-erythematous. Neck: No stridor.   Cardiovascular: Normal rate, regular rhythm. Grossly normal heart sounds.  Good peripheral circulation. Respiratory: Normal respiratory effort.  No  retractions. Lungs CTAB. Gastrointestinal: Soft and nontender. No distention. No abdominal bruits. No CVA tenderness. Musculoskeletal: No lower extremity tenderness nor edema.  No joint effusions. Neurologic:  Normal speech and language. No gross focal neurologic deficits are appreciated. No gait instability. Skin:  Skin is warm, dry and intact. No rash noted. Psychiatric: Mood and affect are normal. Speech and behavior are normal.  ____________________________________________   LABS (all labs ordered are listed, but only abnormal results are displayed)  Labs Reviewed  LIPASE, BLOOD - Abnormal; Notable for the following:       Result Value   Lipase 87 (*)    All other components within normal limits  COMPREHENSIVE METABOLIC PANEL - Abnormal; Notable for the following:    Glucose, Bld 158 (*)    All other components within normal limits  CBC - Abnormal; Notable for the following:    WBC 12.5 (*)    MCH 34.1 (*)    All other components within normal limits  URINALYSIS, COMPLETE (UACMP) WITH MICROSCOPIC - Abnormal; Notable for the following:    Color, Urine YELLOW (*)    APPearance HAZY (*)    Hgb urine dipstick MODERATE (*)    Ketones, ur 5 (*)    Protein, ur 30 (*)    Nitrite POSITIVE (*)    Leukocytes, UA MODERATE (*)    Bacteria, UA MANY (*)    Squamous Epithelial / LPF 0-5 (*)    All other components within normal limits  POC URINE PREG, ED  POCT PREGNANCY, URINE   ____________________________________________  EKG   ____________________________________________  RADIOLOGY  Ct Abdomen Pelvis W Contrast  Result Date: 09/18/2016 CLINICAL DATA:  Patient with right flank pain.  Low-grade fever. EXAM: CT ABDOMEN AND PELVIS WITH CONTRAST TECHNIQUE: Multidetector CT imaging of the abdomen and pelvis was performed using the standard protocol following bolus administration of intravenous contrast. CONTRAST:  46mL ISOVUE-300 IOPAMIDOL (ISOVUE-300) INJECTION 61% COMPARISON:  CT  abdomen pelvis 10/24/2013. FINDINGS: Lower chest: Normal heart size. Lung bases are clear. No pleural effusion. Hepatobiliary: Liver is normal in size and contour. No focal hepatic lesions identified. Gallbladder is unremarkable. No intrahepatic or extrahepatic biliary ductal dilatation. Pancreas: Mild prominence of the pancreatic duct. No surrounding inflammatory change. Spleen: Unremarkable Adrenals/Urinary Tract: The adrenal glands are normal. There is patchy enhancement of the right kidney predominantly involving the superior and mid poles. Urinary bladder is unremarkable. Subcentimeter too small to characterize low-attenuation lesion interpolar region left kidney. Stomach/Bowel: No abnormal bowel wall thickening or evidence for bowel obstruction. No free intraperitoneal air. Normal appendix. Vascular/Lymphatic: Normal caliber abdominal aorta. No retroperitoneal lymphadenopathy. Peripheral calcified atherosclerotic plaque involving the abdominal aorta. Reproductive: Uterus is unremarkable. Other: None. Musculoskeletal: Lumbar spine degenerative changes. No aggressive or acute appearing osseous lesions. IMPRESSION: Patchy enhancement of the right kidney most compatible with pyelonephritis. Electronically Signed   By: Lovey Newcomer M.D.   On: 09/18/2016 15:55    ____________________________________________  PROCEDURES  Procedure(s) performed: None  Procedures  Critical Care performed: No  ____________________________________________   INITIAL IMPRESSION / ASSESSMENT AND PLAN / ED COURSE  Pertinent labs & imaging results that were available during my care of the patient were reviewed by me and considered in my medical decision making (see chart for details).  36 year old female presenting to the emergency department for evaluation and treatment of symptoms most consistent with pyelonephritis. Vital signs are not consistent with sepsis. She will be given Levaquin, morphine, and fluids while  awaiting CT. She is aware of the plan and agrees to treatment. ----------------------------------------- 4:19 PM on 09/18/2016 -----------------------------------------  CT results discussed with the patient and family. Nausea has resolved and pain has significantly improved after zofran and morphine. Levaquin is infusing at this time. Plan will be to discharge her home after levaquin is finished with prescription for the same as well as norco and zofran. She tells me she will be able to follow up with her primary care provider Monday or Tuesday and will call in the morning for an appointment. She was advised to return to the ER for symptoms that change or worsen if she is unable to see the primary care provider.  ____________________________________________   FINAL CLINICAL IMPRESSION(S) / ED DIAGNOSES  Final diagnoses:  Pyelonephritis, acute      NEW MEDICATIONS STARTED DURING THIS VISIT:  Discharge Medication List as of 09/18/2016  4:30 PM    START taking these medications   Details  HYDROcodone-acetaminophen (NORCO/VICODIN) 5-325 MG tablet Take 1 tablet by mouth every 4 (four) hours as needed for moderate pain., Starting Sun 09/18/2016, Until Mon 09/18/2017, Print    levofloxacin (LEVAQUIN) 750 MG tablet Take 1 tablet (750 mg total) by mouth daily., Starting Sun 09/18/2016, Until Wed 09/28/2016, Print    ondansetron (ZOFRAN-ODT) 4 MG disintegrating tablet Take 1 tablet (4 mg total) by mouth every 8 (eight) hours as needed for nausea or vomiting., Starting Sun 09/18/2016, Print         Note:  This document was prepared using Dragon voice recognition software and may include unintentional dictation errors.    Victorino Dike, FNP 09/18/16 1818    Harvest Dark, MD 09/18/16 2315

## 2016-09-18 NOTE — ED Notes (Signed)
Pt taken to CT via stretcher.

## 2016-09-18 NOTE — ED Triage Notes (Signed)
Patient presents to the ED with right flank pain, low-grade fever, urinary frequency and dysuria as well as vomiting.  Patient states flank pain began about 1 week ago but other symptoms began yesterday evening.  Patient reports history of kidney infection.  Patient is tearful in triage.

## 2016-09-18 NOTE — Discharge Instructions (Signed)
Please see your primary care provider early this week. Take the antibiotic as prescribed and until finished. Take the nausea and pain medication as directed if needed. Return to the ER immediately if your symptoms worsen and you are unable to see primary care right away.  Keep yourself well hydrated.

## 2016-09-20 DIAGNOSIS — N12 Tubulo-interstitial nephritis, not specified as acute or chronic: Secondary | ICD-10-CM | POA: Insufficient documentation

## 2017-11-18 ENCOUNTER — Other Ambulatory Visit: Payer: Self-pay

## 2017-11-18 ENCOUNTER — Emergency Department: Payer: Self-pay

## 2017-11-18 ENCOUNTER — Emergency Department
Admission: EM | Admit: 2017-11-18 | Discharge: 2017-11-18 | Disposition: A | Discharge status: Home / Self Care | Payer: Self-pay | Attending: Emergency Medicine | Admitting: Emergency Medicine

## 2017-11-18 DIAGNOSIS — R112 Nausea with vomiting, unspecified: Secondary | ICD-10-CM

## 2017-11-18 DIAGNOSIS — R1013 Epigastric pain: Secondary | ICD-10-CM

## 2017-11-18 DIAGNOSIS — G43A0 Cyclical vomiting, not intractable: Secondary | ICD-10-CM | POA: Insufficient documentation

## 2017-11-18 DIAGNOSIS — F17210 Nicotine dependence, cigarettes, uncomplicated: Secondary | ICD-10-CM | POA: Insufficient documentation

## 2017-11-18 DIAGNOSIS — J449 Chronic obstructive pulmonary disease, unspecified: Secondary | ICD-10-CM | POA: Insufficient documentation

## 2017-11-18 DIAGNOSIS — Z79899 Other long term (current) drug therapy: Secondary | ICD-10-CM | POA: Insufficient documentation

## 2017-11-18 DIAGNOSIS — R079 Chest pain, unspecified: Secondary | ICD-10-CM | POA: Insufficient documentation

## 2017-11-18 LAB — HCG, QUANTITATIVE, PREGNANCY

## 2017-11-18 LAB — URINALYSIS, COMPLETE (UACMP) WITH MICROSCOPIC
BACTERIA UA: NONE SEEN
BILIRUBIN URINE: NEGATIVE
GLUCOSE, UA: 50 mg/dL — AB
Ketones, ur: 80 mg/dL — AB
Leukocytes, UA: NEGATIVE
NITRITE: NEGATIVE
PROTEIN: NEGATIVE mg/dL
Specific Gravity, Urine: 1.014 (ref 1.005–1.030)
pH: 7 (ref 5.0–8.0)

## 2017-11-18 LAB — BASIC METABOLIC PANEL
ANION GAP: 12 (ref 5–15)
BUN: 14 mg/dL (ref 6–20)
CALCIUM: 9.4 mg/dL (ref 8.9–10.3)
CO2: 24 mmol/L (ref 22–32)
CREATININE: 0.65 mg/dL (ref 0.44–1.00)
Chloride: 104 mmol/L (ref 98–111)
GFR calc Af Amer: >60 mL/min (ref >60)
GFR calc non Af Amer: >60 mL/min (ref >60)
GLUCOSE: 139 mg/dL — AB (ref 70–99)
Potassium: 3.7 mmol/L (ref 3.5–5.1)
Sodium: 140 mmol/L (ref 135–145)

## 2017-11-18 LAB — CBC
HCT: 44.1 % (ref 35.0–47.0)
HEMOGLOBIN: 15.4 g/dL (ref 12.0–16.0)
MCH: 34.9 pg — AB (ref 26.0–34.0)
MCHC: 34.9 g/dL (ref 32.0–36.0)
MCV: 99.9 fL (ref 80.0–100.0)
PLATELETS: 331 10*3/uL (ref 150–440)
RBC: 4.42 MIL/uL (ref 3.80–5.20)
RDW: 12.8 % (ref 11.5–14.5)
WBC: 13.3 10*3/uL — ABNORMAL HIGH (ref 3.6–11.0)

## 2017-11-18 LAB — HEPATIC FUNCTION PANEL
ALBUMIN: 4.9 g/dL (ref 3.5–5.0)
ALT: 18 U/L (ref 0–44)
AST: 26 U/L (ref 15–41)
Alkaline Phosphatase: 55 U/L (ref 38–126)
Total Bilirubin: 0.7 mg/dL (ref 0.3–1.2)
Total Protein: 8 g/dL (ref 6.5–8.1)

## 2017-11-18 LAB — TROPONIN I

## 2017-11-18 LAB — POCT PREGNANCY, URINE: Preg Test, Ur: NEGATIVE

## 2017-11-18 LAB — LIPASE, BLOOD: Lipase: 30 U/L (ref 11–51)

## 2017-11-18 MED ORDER — FAMOTIDINE IN NACL 20-0.9 MG/50ML-% IV SOLN
20.0000 mg | Freq: Once | INTRAVENOUS | Status: AC
  Administered 2017-11-18: 20 mg via INTRAVENOUS
  Filled 2017-11-18: qty 50

## 2017-11-18 MED ORDER — MORPHINE SULFATE (PF) 4 MG/ML IV SOLN
4.0000 mg | Freq: Once | INTRAVENOUS | Status: AC
  Administered 2017-11-18: 4 mg via INTRAVENOUS
  Filled 2017-11-18: qty 1

## 2017-11-18 MED ORDER — SODIUM CHLORIDE 0.9 % IV BOLUS
1000.0000 mL | Freq: Once | INTRAVENOUS | Status: AC
  Administered 2017-11-18: 1000 mL via INTRAVENOUS

## 2017-11-18 MED ORDER — ONDANSETRON HCL 4 MG/2ML IJ SOLN
4.0000 mg | Freq: Once | INTRAMUSCULAR | Status: AC
  Administered 2017-11-18: 4 mg via INTRAVENOUS
  Filled 2017-11-18: qty 2

## 2017-11-18 MED ORDER — ONDANSETRON HCL 4 MG/2ML IJ SOLN
INTRAMUSCULAR | Status: AC
  Filled 2017-11-18: qty 2

## 2017-11-18 MED ORDER — ONDANSETRON 4 MG PO TBDP: 4.0000 mg | ORAL_TABLET | Freq: Three times a day (TID) | ORAL | 0 refills | Status: DC | PRN

## 2017-11-18 MED ORDER — ONDANSETRON HCL 4 MG/2ML IJ SOLN
4.0000 mg | Freq: Once | INTRAMUSCULAR | Status: AC
  Administered 2017-11-18: 4 mg via INTRAVENOUS

## 2017-11-18 MED ORDER — FAMOTIDINE 20 MG PO TABS: 20.0000 mg | ORAL_TABLET | Freq: Two times a day (BID) | ORAL | 1 refills | Status: DC

## 2017-11-18 NOTE — ED Provider Notes (Signed)
[facility] Emergency Department Provider Note  ____________________________________________  Time seen: Approximately 11:39 AM  I have reviewed the triage vital signs and the nursing notes.   HISTORY  Chief Complaint Chest Pain   HPI HSER BELANGER is a 37 y.o. female with a history of COPD, pyelonephritis, and nonischemic cardiomyopathy recently diagnosed at [facility] (waiting for myocardium biopsy for etiology) presents for evaluation of abdominal pain.  Patient reports that she started having pain yesterday evening.  The pain is sharp, located in the epigastric region, constant and nonradiating.  Patient reports that she has had several episodes of nonbloody nonbilious emesis.  This morning after having another episode of vomiting patient started having sharp intermittent chest pain.  No shortness of breath, no dizziness, no diarrhea or constipation, no fever or chills, no cough or congestion, no dysuria or hematuria.  No prior abdominal surgeries.  No personal family history of blood clots, no recent travel immobilization, no leg pain or swelling, no hemoptysis, no exogenous hormones.  No family history of heart attacks.  She smokes. Drinks 1 wine glass a night, denies drug use.   Past Medical History:  Diagnosis Date  . COPD (chronic obstructive pulmonary disease) ([place])   . History of kidney infection     Prior to Admission medications   Medication Sig Start Date End Date Taking? Authorizing Provider  cyclobenzaprine (FLEXERIL) 5 MG tablet Take 1 tablet (5 mg total) by mouth 3 (three) times daily as needed for muscle spasms. 01/11/16   Drenda Freeze, MD  famotidine (PEPCID) 20 MG tablet Take 1 tablet (20 mg total) by mouth 2 (two) times daily. 11/18/17 11/18/18  Rudene Re, MD  ibuprofen (ADVIL,MOTRIN) 600 MG tablet Take 1 tablet (600 mg total) by mouth every 6 (six) hours as needed. 01/11/16   Drenda Freeze, MD  ondansetron (ZOFRAN ODT) 4 MG  disintegrating tablet Take 1 tablet (4 mg total) by mouth every 8 (eight) hours as needed for nausea or vomiting. 11/18/17   Rudene Re, MD  predniSONE (DELTASONE) 20 MG tablet Take 60 mg daily x 2 days then 40 mg daily x 2 days then 20 mg daily x 2 days 01/11/16   Drenda Freeze, MD  [facility] [address] [phone] Base) MCG/ACT AEPB Inhale 2 puffs into the lungs every 6 (six) hours as needed. 12/16/15   [provider]    Allergies Amoxicillin and Penicillins  FH Lung cancer Mother     Social History Social History   Tobacco Use  . Smoking status: Current Every Day Smoker    Packs/day: 1.00    Types: Cigarettes  . Smokeless tobacco: Never Used  Substance Use Topics  . Alcohol use: Yes    Comment: occasional  . Drug use: No    Review of Systems  Constitutional: Negative for fever. Eyes: Negative for visual changes. ENT: Negative for sore throat. Neck: No neck pain  Cardiovascular: + chest pain. Respiratory: Negative for shortness of breath. Gastrointestinal: + epigastric abdominal pain, nausea, and vomiting. No diarrhea. Genitourinary: Negative for dysuria. Musculoskeletal: Negative for back pain. Skin: Negative for rash. Neurological: Negative for headaches, weakness or numbness. Psych: No SI or HI  ____________________________________________   PHYSICAL EXAM:  VITAL SIGNS: ED Triage Vitals  Enc Vitals Group     BP 11/18/17 0925 (!) 146/118     Pulse Rate 11/18/17 0925 97     Resp 11/18/17 0925 18     Temp 11/18/17 0925 98 F (36.7  C)     Temp Source 11/18/17 0925 Oral     SpO2 11/18/17 0925 100 %     Weight 11/18/17 0932 109 lb (49.4 kg)     Height 11/18/17 0932 5\' 2"  (1.575 m)     Head Circumference --      Peak Flow --      Pain Score 11/18/17 0927 10     Pain Loc --      Pain Edu? --      Excl. in [place]? --     Constitutional: Alert and oriented, actively vomiting.  HEENT:      Head: Normocephalic and atraumatic.         Eyes:  Conjunctivae are normal. Sclera is non-icteric.       Mouth/Throat: Mucous membranes are moist.       Neck: Supple with no signs of meningismus. Cardiovascular: Regular rate and rhythm. No murmurs, gallops, or rubs. 2+ symmetrical distal pulses are present in all extremities. No JVD. Respiratory: Normal respiratory effort. Lungs are clear to auscultation bilaterally. No wheezes, crackles, or rhonchi.  Gastrointestinal: Soft, tender to palpation over the epigastric and LUQ, and non distended with positive bowel sounds. No rebound or guarding. Genitourinary: No CVA tenderness. Musculoskeletal: Nontender with normal range of motion in all extremities. No edema, cyanosis, or erythema of extremities. Neurologic: Normal speech and language. Face is symmetric. Moving all extremities. No gross focal neurologic deficits are appreciated. Skin: Skin is warm, dry and intact. No rash noted. Psychiatric: Mood and affect are normal. Speech and behavior are normal.  ____________________________________________   LABS (all labs ordered are listed, but only abnormal results are displayed)  Labs Reviewed  BASIC METABOLIC PANEL - Abnormal; Notable for the following components:      Result Value   Glucose, Bld 139 (*)    All other components within normal limits  CBC - Abnormal; Notable for the following components:   WBC 13.3 (*)    MCH 34.9 (*)    All other components within normal limits  TROPONIN I  HEPATIC FUNCTION PANEL  LIPASE, BLOOD  HCG, QUANTITATIVE, PREGNANCY  URINALYSIS, COMPLETE (UACMP) WITH MICROSCOPIC  POC URINE PREG, ED   ____________________________________________  EKG  ED ECG REPORT I, Rudene Re, the attending physician, personally viewed and interpreted this ECG.  Normal sinus rhythm, rate of 97, normal intervals, RAD, no ST elevations or depressions.  Unchanged from prior ____________________________________________  RADIOLOGY  I have personally reviewed the  images performed during this visit and I agree with the Radiologist's read.   Interpretation by Radiologist:  Dg Chest 2 View  Result Date: 11/18/2017 CLINICAL DATA:  Nausea and dry heaving.  Chest pain. EXAM: CHEST - 2 VIEW COMPARISON:  January 11, 2016 FINDINGS: The heart, hila, and mediastinum are normal. Haziness over the bases is likely due to overlapping soft tissues. No focal infiltrate identified on the lateral view. No pulmonary nodules or masses. No overt edema. No pneumothorax. IMPRESSION: No active cardiopulmonary disease. Electronically Signed   By: Dorise Bullion III M.D   On: 11/18/2017 09:59   Dg Abd Portable 2 Views  Result Date: 11/18/2017 CLINICAL DATA:  37 year old female with a history of abdominal pain EXAM: PORTABLE ABDOMEN - 2 VIEW COMPARISON:  Seventy-two thousand eighteen FINDINGS: Paucity of gas within small bowel. Gas within the left abdomen, likely descending colon, and mild formed stool burden within the rectum. No unexpected radiopaque foreign body.  No unexpected calcification. Pelvic phleboliths. No displaced fracture. IMPRESSION: Nonobstructive  bowel gas pattern with mild stool burden. Electronically Signed   By: Corrie Mckusick D.O.   On: 11/18/2017 10:24     ____________________________________________   PROCEDURES  Procedure(s) performed: None Procedures Critical Care performed:  None ____________________________________________   INITIAL IMPRESSION / ASSESSMENT AND PLAN / ED COURSE  37 y.o. female with a history of COPD, pyelonephritis, and nonischemic cardiomyopathy recently diagnosed at [facility] (waiting for myocardium biopsy for etiology) presents for evaluation of abdominal pain, nausea, and vomiting. Now also with CP after several episodes of vomiting.  Patient is actively vomiting looks uncomfortable, vitals are within normal limits, she is afebrile, abdomen is soft with tenderness to palpation on the epigastric and left upper quadrant.  No rebound or  guarding.  EKG showed no evidence of ischemia or dysrhythmias.  Differential diagnoses including peptic ulcer disease versus gastritis versus pancreatitis versus gallbladder disease versus GERD.  Patient has no lower abdominal tenderness with no clinical signs or symptoms of appendicitis or ovarian pathology.  Labs showing normal CMP, lipase, hCG, troponin. Also showed mild leukocytosis consistent with gastritis/ stress induced due to several episodes of vomiting. After IV morphine, pepcid, IVF and zofran patient feels markedly improved.  She no longer has any tenderness on her abdominal exam.  She is tolerating p.o.  Bedside ultrasound showing no evidence of pericardial effusion, showing normal gallbladder with no stones.  Will discharge patient home with Pepcid and Zofran.  Recommend close follow-up with primary care doctor.  Discussed return precautions for new or worsening abdominal pain, fever, recurrence of her vomiting.      As part of my medical decision making, I reviewed the following data within the [place] notes reviewed and incorporated, Labs reviewed , EKG interpreted , Old EKG reviewed, Old chart reviewed, Radiograph reviewed , Notes from prior ED visits and [place] Controlled Substance Database    Pertinent labs & imaging results that were available during my care of the patient were reviewed by me and considered in my medical decision making (see chart for details).    ____________________________________________   FINAL CLINICAL IMPRESSION(S) / ED DIAGNOSES  Final diagnoses:  Epigastric abdominal pain  Non-intractable vomiting with nausea, unspecified vomiting type      NEW MEDICATIONS STARTED DURING THIS VISIT:  ED Discharge Orders         Ordered    famotidine (PEPCID) 20 MG tablet  2 times daily     11/18/17 1159    ondansetron (ZOFRAN ODT) 4 MG disintegrating tablet  Every 8 hours PRN     11/18/17 1159           Note:  This document  was prepared using Dragon voice recognition software and may include unintentional dictation errors.    Alfred Levins, [place], MD 11/18/17 1159

## 2017-11-18 NOTE — ED Notes (Signed)
Patient transported to X-ray 

## 2017-11-18 NOTE — ED Notes (Addendum)
Pt reports that she had a heart attack in May - upon review of chart pt was admitted to [facility] and signed out [facility] with elevated troponin and dx of sarcoidosis or myocarditis

## 2017-11-18 NOTE — Discharge Instructions (Signed)

## 2017-11-18 NOTE — ED Notes (Signed)
Pt making loud gagging noises but when another person is in room or speaking to pt she is able to stop the gagging noise

## 2017-11-18 NOTE — ED Triage Notes (Signed)
Pt states that her chest started hurting last night states that she had a heart attack in may, states that her pain reminds her of then. Pt is nauseated and dry heaving in triage at this time.

## 2017-12-23 ENCOUNTER — Emergency Department: Payer: Medicaid Other

## 2017-12-23 ENCOUNTER — Emergency Department
Admission: EM | Admit: 2017-12-23 | Discharge: 2017-12-23 | Disposition: A | Discharge status: Home / Self Care | Payer: Medicaid Other | Attending: Emergency Medicine | Admitting: Emergency Medicine

## 2017-12-23 ENCOUNTER — Other Ambulatory Visit: Payer: Self-pay

## 2017-12-23 ENCOUNTER — Encounter: Payer: Self-pay | Admitting: Emergency Medicine

## 2017-12-23 DIAGNOSIS — R1010 Upper abdominal pain, unspecified: Secondary | ICD-10-CM

## 2017-12-23 DIAGNOSIS — R1012 Left upper quadrant pain: Secondary | ICD-10-CM | POA: Insufficient documentation

## 2017-12-23 DIAGNOSIS — F17210 Nicotine dependence, cigarettes, uncomplicated: Secondary | ICD-10-CM | POA: Insufficient documentation

## 2017-12-23 DIAGNOSIS — R079 Chest pain, unspecified: Secondary | ICD-10-CM | POA: Insufficient documentation

## 2017-12-23 DIAGNOSIS — J449 Chronic obstructive pulmonary disease, unspecified: Secondary | ICD-10-CM | POA: Insufficient documentation

## 2017-12-23 HISTORY — DX: Acute myocardial infarction, unspecified: I21.9

## 2017-12-23 HISTORY — DX: Gastric ulcer, unspecified as acute or chronic, without hemorrhage or perforation: K25.9

## 2017-12-23 LAB — BASIC METABOLIC PANEL
Anion gap: 10 (ref 5–15)
BUN: 11 mg/dL (ref 6–20)
CHLORIDE: 104 mmol/L (ref 98–111)
CO2: 25 mmol/L (ref 22–32)
Calcium: 9.2 mg/dL (ref 8.9–10.3)
Creatinine, Ser: 0.79 mg/dL (ref 0.44–1.00)
GFR calc Af Amer: >60 mL/min (ref >60)
GFR calc non Af Amer: >60 mL/min (ref >60)
Glucose, Bld: 169 mg/dL — ABNORMAL HIGH (ref 70–99)
POTASSIUM: 3.6 mmol/L (ref 3.5–5.1)
Sodium: 139 mmol/L (ref 135–145)

## 2017-12-23 LAB — URINALYSIS, COMPLETE (UACMP) WITH MICROSCOPIC
BACTERIA UA: NONE SEEN
Bilirubin Urine: NEGATIVE
Glucose, UA: 50 mg/dL — AB
HGB URINE DIPSTICK: NEGATIVE
Ketones, ur: 20 mg/dL — AB
Leukocytes, UA: NEGATIVE
Nitrite: NEGATIVE
PROTEIN: NEGATIVE mg/dL
Specific Gravity, Urine: 1.012 (ref 1.005–1.030)
pH: 6 (ref 5.0–8.0)

## 2017-12-23 LAB — HEPATIC FUNCTION PANEL
ALT: 20 U/L (ref 0–44)
AST: 27 U/L (ref 15–41)
Albumin: 4.6 g/dL (ref 3.5–5.0)
Alkaline Phosphatase: 57 U/L (ref 38–126)
BILIRUBIN TOTAL: 0.3 mg/dL (ref 0.3–1.2)
Total Protein: 7.7 g/dL (ref 6.5–8.1)

## 2017-12-23 LAB — CBC
HEMATOCRIT: 44.2 % (ref 36.0–46.0)
HEMOGLOBIN: 15.4 g/dL — AB (ref 12.0–15.0)
MCH: 34.1 pg — ABNORMAL HIGH (ref 26.0–34.0)
MCHC: 34.8 g/dL (ref 30.0–36.0)
MCV: 98 fL (ref 80.0–100.0)
NRBC: 0 % (ref 0.0–0.2)
Platelets: 329 10*3/uL (ref 150–400)
RBC: 4.51 MIL/uL (ref 3.87–5.11)
RDW: 12.4 % (ref 11.5–15.5)
WBC: 9.3 10*3/uL (ref 4.0–10.5)

## 2017-12-23 LAB — LIPASE, BLOOD: Lipase: 28 U/L (ref 11–51)

## 2017-12-23 LAB — POCT PREGNANCY, URINE: Preg Test, Ur: NEGATIVE

## 2017-12-23 LAB — TROPONIN I: Troponin I: <0.03 ng/mL (ref <0.03)

## 2017-12-23 MED ORDER — MORPHINE SULFATE (PF) 4 MG/ML IV SOLN
4.0000 mg | Freq: Once | INTRAVENOUS | Status: AC
  Administered 2017-12-23: 4 mg via INTRAVENOUS

## 2017-12-23 MED ORDER — ONDANSETRON HCL 4 MG/2ML IJ SOLN: 4.0000 mg | Freq: Once | INTRAMUSCULAR | Status: DC

## 2017-12-23 MED ORDER — ONDANSETRON HCL 4 MG/2ML IJ SOLN
INTRAMUSCULAR | Status: AC
  Administered 2017-12-23: 4 mg via INTRAVENOUS
  Filled 2017-12-23: qty 2

## 2017-12-23 MED ORDER — IOHEXOL 300 MG/ML  SOLN
75.0000 mL | Freq: Once | INTRAMUSCULAR | Status: AC | PRN
  Administered 2017-12-23: 75 mL via INTRAVENOUS

## 2017-12-23 MED ORDER — FAMOTIDINE IN NACL 20-0.9 MG/50ML-% IV SOLN
20.0000 mg | Freq: Once | INTRAVENOUS | Status: AC
  Administered 2017-12-23: 20 mg via INTRAVENOUS
  Filled 2017-12-23: qty 50

## 2017-12-23 MED ORDER — MORPHINE SULFATE (PF) 4 MG/ML IV SOLN
INTRAVENOUS | Status: AC
  Filled 2017-12-23: qty 1

## 2017-12-23 MED ORDER — MORPHINE SULFATE (PF) 4 MG/ML IV SOLN
4.0000 mg | Freq: Once | INTRAVENOUS | Status: AC
  Administered 2017-12-23: 4 mg via INTRAVENOUS
  Filled 2017-12-23: qty 1

## 2017-12-23 MED ORDER — ONDANSETRON HCL 4 MG/2ML IJ SOLN
4.0000 mg | Freq: Once | INTRAMUSCULAR | Status: AC | PRN
  Administered 2017-12-23: 4 mg via INTRAVENOUS

## 2017-12-23 MED ORDER — IOPAMIDOL (ISOVUE-300) INJECTION 61%
15.0000 mL | INTRAVENOUS | Status: AC
  Administered 2017-12-23: 15 mL via ORAL

## 2017-12-23 NOTE — ED Notes (Signed)
Patient transported to CT 

## 2017-12-23 NOTE — Discharge Instructions (Addendum)

## 2017-12-23 NOTE — ED Notes (Signed)
Report to angela, rn. 

## 2017-12-23 NOTE — ED Provider Notes (Signed)
[facility] Emergency Department Provider Note   ____________________________________________   First MD Initiated Contact with Patient 12/23/17 [phone]     (approximate)  I have reviewed the triage vital signs and the nursing notes.   HISTORY  Chief Complaint Vomiting and abdominal pain  Patient reports that she began having vomiting and burning discomfort in her left upper abdomen for about 2 weeks.  She reports she occasionally vomits, became worse this evening and she vomited several times.  Reports a severe burning and painful discomfort in her left upper abdomen.  Denies trouble breathing.  Denies chest pain except occasionally will feel like a burning from her abdomen up a little bit towards the left lower side of her chest.  No fevers or chills.  Reports the pain and discomfort are severe.  She reports she was given prescriptions, but has not been taking them.  Patient also has a history of COPD previous cardiac disease, but denies chest pain except for a burning discomfort that radiates from the left upper abdomen.  No trouble breathing or wheezing   HPI LUPE HANDLEY is a 37 y.o. female    Past Medical History:  Diagnosis Date  . COPD (chronic obstructive pulmonary disease) ([place])   . Gastric ulcer   . History of kidney infection   . MI (myocardial infarction) ([place])     There are no active problems to display for this patient.   History reviewed. No pertinent surgical history.  Prior to Admission medications   Medication Sig Start Date End Date Taking? Authorizing Provider  cyclobenzaprine (FLEXERIL) 5 MG tablet Take 1 tablet (5 mg total) by mouth 3 (three) times daily as needed for muscle spasms. 01/11/16   Drenda Freeze, MD  famotidine (PEPCID) 20 MG tablet Take 1 tablet (20 mg total) by mouth 2 (two) times daily. 11/18/17 11/18/18  Rudene Re, MD  ibuprofen (ADVIL,MOTRIN) 600 MG tablet Take 1 tablet (600 mg total) by mouth  every 6 (six) hours as needed. 01/11/16   Drenda Freeze, MD  ondansetron (ZOFRAN ODT) 4 MG disintegrating tablet Take 1 tablet (4 mg total) by mouth every 8 (eight) hours as needed for nausea or vomiting. 11/18/17   Rudene Re, MD  predniSONE (DELTASONE) 20 MG tablet Take 60 mg daily x 2 days then 40 mg daily x 2 days then 20 mg daily x 2 days 01/11/16   Drenda Freeze, MD  [facility] [phone] Base) MCG/ACT AEPB Inhale 2 puffs into the lungs every 6 (six) hours as needed. 12/16/15   [provider]    Allergies Amoxicillin; Banana; and Penicillins  No family history on file.  Social History Social History   Tobacco Use  . Smoking status: Current Every Day Smoker    Packs/day: 1.00    Types: Cigarettes  . Smokeless tobacco: Never Used  Substance Use Topics  . Alcohol use: Yes    Comment: occasional  . Drug use: No    Review of Systems Constitutional: No fever/chills Eyes: No visual changes. ENT: No sore throat. Cardiovascular: Denies chest pain. Respiratory: Denies shortness of breath. Gastrointestinal: See HPI Genitourinary: Negative for dysuria.  Normal menstrual cycle, last about 1 month ago.  Denies any vaginal discharge or vaginal or pelvic discomfort.  No black or bloody stool Musculoskeletal: Negative for back pain. Skin: Negative for rash. Neurological: Negative for headaches, areas of focal weakness or numbness.    ____________________________________________   PHYSICAL EXAM:  VITAL SIGNS: ED  Triage Vitals [12/23/17 0046]  Enc Vitals Group     BP (!) 158/98     Pulse Rate 72     Resp 20     Temp 97.7 F (36.5 C)     Temp Source Oral     SpO2 100 %     Weight 103 lb (46.7 kg)     Height 5\' 2"  (1.575 m)     Head Circumference      Peak Flow      Pain Score 9     Pain Loc      Pain Edu?      Excl. in [place]?     Constitutional: Alert and oriented.  When anxious appearing, reports severe pain in her left upper  abdomen. Eyes: Conjunctivae are normal. Head: Atraumatic. Nose: No congestion/rhinnorhea. Mouth/Throat: Mucous membranes are moist. Neck: No stridor.  Cardiovascular: Normal rate, regular rhythm. Grossly normal heart sounds.  Good peripheral circulation. Respiratory: Normal respiratory effort.  No retractions. Lungs CTAB. Gastrointestinal: Soft and moderately tender in the left upper quadrant, negative Murphy.  No tenderness in the lower quadrants bilaterally.. No distention. Musculoskeletal: No lower extremity tenderness nor edema. Neurologic:  Normal speech and language. No gross focal neurologic deficits are appreciated.  Skin:  Skin is warm, dry and intact. No rash noted. Psychiatric: Mood and affect are normal slightly anxious. Speech and behavior are normal.  ____________________________________________   LABS (all labs ordered are listed, but only abnormal results are displayed)  Labs Reviewed  BASIC METABOLIC PANEL - Abnormal; Notable for the following components:      Result Value   Glucose, Bld 169 (*)    All other components within normal limits  CBC - Abnormal; Notable for the following components:   Hemoglobin 15.4 (*)    MCH 34.1 (*)    All other components within normal limits  URINALYSIS, COMPLETE (UACMP) WITH MICROSCOPIC - Abnormal; Notable for the following components:   Color, Urine STRAW (*)    APPearance CLOUDY (*)    Glucose, UA 50 (*)    Ketones, ur 20 (*)    All other components within normal limits  TROPONIN I  LIPASE, BLOOD  HEPATIC FUNCTION PANEL  POC URINE PREG, ED  POCT PREGNANCY, URINE   ____________________________________________  EKG  Reviewed and entered by me at 0040 Heart rate 80 QRS 95 QTc 470 Normal sinus rhythm, no evidence of acute ischemic changes. ____________________________________________  RADIOLOGY  Dg Chest 1 View  Result Date: 12/23/2017 CLINICAL DATA:  Vomiting and left chest burning for 2 weeks. Worse tonight.  History of gastric ulcer. EXAM: CHEST  1 VIEW COMPARISON:  11/18/2017 FINDINGS: Mild hyperinflation. Normal heart size and pulmonary vascularity. No focal airspace disease or consolidation in the lungs. No blunting of costophrenic angles. No pneumothorax. Mediastinal contours appear intact. IMPRESSION: No active disease. Electronically Signed   By: Lucienne Capers M.D.   On: 12/23/2017 01:45   Ct Abdomen Pelvis W Contrast  Result Date: 12/23/2017 CLINICAL DATA:  Vomiting and abdominal pain EXAM: CT ABDOMEN AND PELVIS WITH CONTRAST TECHNIQUE: Multidetector CT imaging of the abdomen and pelvis was performed using the standard protocol following bolus administration of intravenous contrast. CONTRAST:  69mL OMNIPAQUE IOHEXOL 300 MG/ML  SOLN COMPARISON:  CT abdomen pelvis 09/18/2016 FINDINGS: LOWER CHEST: There is no basilar pleural or apical pericardial effusion. HEPATOBILIARY: The hepatic contours and density are normal. There is no intra- or extrahepatic biliary dilatation. The gallbladder is normal. PANCREAS: The pancreatic parenchymal contours are  normal and there is no ductal dilatation. There is no peripancreatic fluid collection. SPLEEN: Normal. ADRENALS/URINARY TRACT: --Adrenal glands: Normal. --Right kidney/ureter: No hydronephrosis, nephroureterolithiasis, perinephric stranding or solid renal mass. --Left kidney/ureter: No hydronephrosis, nephroureterolithiasis, perinephric stranding or solid renal mass. --Urinary bladder: Normal for degree of distention STOMACH/BOWEL: --Stomach/Duodenum: There is no hiatal hernia or other gastric abnormality. The duodenal course and caliber are normal. --Small bowel: No dilatation or inflammation. --Colon: There is stool in the proximal colon with the remainder of the colon is completely decompressed. --Appendix: Normal. VASCULAR/LYMPHATIC: Normal course and caliber of the major abdominal vessels. No abdominal or pelvic lymphadenopathy. REPRODUCTIVE: Normal uterus and  ovaries. MUSCULOSKELETAL. No bony spinal canal stenosis or focal osseous abnormality. OTHER: None. IMPRESSION: 1. No acute abnormality of the abdomen or pelvis. 2. Decompressed colon, beginning at the hepatic flexure. This may be seen in the setting of diarrhea. Electronically Signed   By: Ulyses Jarred M.D.   On: 12/23/2017 06:17   Chest x-ray reviewed negative for acute disease.  CT scan reviewed, no acute abnormalities denoted. ____________________________________________   PROCEDURES  Procedure(s) performed: None  Procedures  Critical Care performed: No  ____________________________________________   INITIAL IMPRESSION / ASSESSMENT AND PLAN / ED COURSE  Pertinent labs & imaging results that were available during my care of the patient were reviewed by me and considered in my medical decision making (see chart for details).   Differential diagnosis includes but is not limited to, abdominal perforation, aortic dissection, cholecystitis, appendicitis, diverticulitis, colitis, esophagitis/gastritis, kidney stone, pyelonephritis, urinary tract infection, aortic aneurysm. All are considered in decision and treatment plan. Based upon the patient's presentation and risk factors, will provide pain relief and proceed with CT scan.  Symptoms sound gastrointestinal in nature.  Possibly reflux, esophagitis gastritis peptic ulcer disease strongly considered.  Differential diagnosis is broad, denies acute cardiac or pulmonary symptoms and appears to be abdominal based on clinical history.  Also similar presentation noted, patient reports she has not been compliant with previously prescribed acid reducer.  Vitals:   12/23/17 0515 12/23/17 0530  BP:  (!) 149/106  Pulse: 84 (!) 45  Resp: 19 15  Temp:    SpO2: 98% 97%       ----------------------------------------- 6:27 AM on 12/23/2017 -----------------------------------------  Patient is presently resting comfortably without distress.  Await  urinalysis result, CT unrevealing of etiology.  Suspect likely gastric or ulcerative type disease given the clinical history. No GYN symptoms.   ----------------------------------------- 6:39 AM on 12/23/2017 ----------------------------------------- Patient resting comfortably at this time, pain improved.  CT reassuring, clinical examination much improved.  Suspect likely secondary to some type of gastric etiology.  Patient is resting comfortably, reports her pain is much better now.  She reports the symptoms getting up and down and were waxing and waning for at least 3 weeks now.  Patient appears well, much improved, plans to have her boyfriend drive her home and agrees to follow-up with GI and return precautions.  Appears much improved, nontoxic and well-appearing at this time.  Return precautions and treatment recommendations and follow-up discussed with the patient who is agreeable with the plan.  ____________________________________________   FINAL CLINICAL IMPRESSION(S) / ED DIAGNOSES  Final diagnoses:  Upper abdominal pain        Note:  This document was prepared using Dragon voice recognition software and may include unintentional dictation errors       Delman Kitten, MD 12/23/17 0700

## 2017-12-23 NOTE — ED Notes (Signed)
Report from john, rn.

## 2017-12-23 NOTE — ED Triage Notes (Signed)
Patient with complaint of vomiting and left side chest burning times two weeks. Patient states that the pain and vomiting became worse tonight. Patient reports history of MI in May with similar symptoms and also a history of gastric ulcer.

## 2017-12-23 NOTE — ED Notes (Signed)
Pt's daughter states "she needs more morphine". Pt clutching abdomen, states '"i'm in pain", md notified.

## 2017-12-23 NOTE — ED Notes (Signed)
Pt up to restroom.

## 2017-12-23 NOTE — ED Notes (Signed)
Great encouragement given to pt to please drink po contrast for ct scan. Pt states "i'll try".

## 2017-12-24 ENCOUNTER — Emergency Department
Admission: EM | Admit: 2017-12-24 | Discharge: 2017-12-24 | Disposition: A | Discharge status: Home / Self Care | Payer: Self-pay | Attending: Emergency Medicine | Admitting: Emergency Medicine

## 2017-12-24 ENCOUNTER — Encounter: Payer: Self-pay | Admitting: Emergency Medicine

## 2017-12-24 ENCOUNTER — Other Ambulatory Visit: Payer: Self-pay

## 2017-12-24 ENCOUNTER — Emergency Department: Payer: Self-pay

## 2017-12-24 DIAGNOSIS — I252 Old myocardial infarction: Secondary | ICD-10-CM | POA: Insufficient documentation

## 2017-12-24 DIAGNOSIS — R61 Generalized hyperhidrosis: Secondary | ICD-10-CM | POA: Insufficient documentation

## 2017-12-24 DIAGNOSIS — K2970 Gastritis, unspecified, without bleeding: Secondary | ICD-10-CM | POA: Insufficient documentation

## 2017-12-24 DIAGNOSIS — J449 Chronic obstructive pulmonary disease, unspecified: Secondary | ICD-10-CM | POA: Insufficient documentation

## 2017-12-24 DIAGNOSIS — R112 Nausea with vomiting, unspecified: Secondary | ICD-10-CM | POA: Insufficient documentation

## 2017-12-24 DIAGNOSIS — F17210 Nicotine dependence, cigarettes, uncomplicated: Secondary | ICD-10-CM | POA: Insufficient documentation

## 2017-12-24 DIAGNOSIS — R079 Chest pain, unspecified: Secondary | ICD-10-CM | POA: Insufficient documentation

## 2017-12-24 LAB — CBC
HCT: 46.3 % — ABNORMAL HIGH (ref 36.0–46.0)
Hemoglobin: 16.1 g/dL — ABNORMAL HIGH (ref 12.0–15.0)
MCH: 33.6 pg (ref 26.0–34.0)
MCHC: 34.8 g/dL (ref 30.0–36.0)
MCV: 96.7 fL (ref 80.0–100.0)
NRBC: 0 % (ref 0.0–0.2)
Platelets: 345 10*3/uL (ref 150–400)
RBC: 4.79 MIL/uL (ref 3.87–5.11)
RDW: 12.2 % (ref 11.5–15.5)
WBC: 12.9 10*3/uL — ABNORMAL HIGH (ref 4.0–10.5)

## 2017-12-24 LAB — COMPREHENSIVE METABOLIC PANEL
ALBUMIN: 4.6 g/dL (ref 3.5–5.0)
ALK PHOS: 57 U/L (ref 38–126)
ALT: 23 U/L (ref 0–44)
AST: 27 U/L (ref 15–41)
Anion gap: 15 (ref 5–15)
BILIRUBIN TOTAL: 0.8 mg/dL (ref 0.3–1.2)
BUN: 13 mg/dL (ref 6–20)
CALCIUM: 9 mg/dL (ref 8.9–10.3)
CO2: 22 mmol/L (ref 22–32)
CREATININE: 0.71 mg/dL (ref 0.44–1.00)
Chloride: 100 mmol/L (ref 98–111)
GFR calc Af Amer: >60 mL/min (ref >60)
GFR calc non Af Amer: >60 mL/min (ref >60)
GLUCOSE: 132 mg/dL — AB (ref 70–99)
Potassium: 3.1 mmol/L — ABNORMAL LOW (ref 3.5–5.1)
Sodium: 137 mmol/L (ref 135–145)
TOTAL PROTEIN: 7.7 g/dL (ref 6.5–8.1)

## 2017-12-24 LAB — LIPASE, BLOOD: Lipase: 30 U/L (ref 11–51)

## 2017-12-24 LAB — TROPONIN I: Troponin I: <0.03 ng/mL (ref <0.03)

## 2017-12-24 MED ORDER — ONDANSETRON 4 MG PO TBDP
4.0000 mg | ORAL_TABLET | Freq: Once | ORAL | Status: AC | PRN
  Administered 2017-12-24: 4 mg via ORAL

## 2017-12-24 MED ORDER — HYDROCODONE-ACETAMINOPHEN 5-325 MG PO TABS
2.0000 | ORAL_TABLET | Freq: Once | ORAL | Status: AC
  Administered 2017-12-24: 2 via ORAL
  Filled 2017-12-24: qty 2

## 2017-12-24 MED ORDER — MORPHINE SULFATE (PF) 4 MG/ML IV SOLN
4.0000 mg | Freq: Once | INTRAVENOUS | Status: AC
  Administered 2017-12-24: 4 mg via INTRAVENOUS
  Filled 2017-12-24: qty 1

## 2017-12-24 MED ORDER — GI COCKTAIL ~~LOC~~
30.0000 mL | Freq: Once | ORAL | Status: AC
  Administered 2017-12-24: 30 mL via ORAL
  Filled 2017-12-24: qty 30

## 2017-12-24 MED ORDER — IOHEXOL 350 MG/ML SOLN
75.0000 mL | Freq: Once | INTRAVENOUS | Status: AC | PRN
  Administered 2017-12-24: 75 mL via INTRAVENOUS

## 2017-12-24 MED ORDER — SODIUM CHLORIDE 0.9 % IV BOLUS
1000.0000 mL | Freq: Once | INTRAVENOUS | Status: AC
  Administered 2017-12-24: 1000 mL via INTRAVENOUS

## 2017-12-24 MED ORDER — ONDANSETRON 4 MG PO TBDP
ORAL_TABLET | ORAL | Status: AC
  Filled 2017-12-24: qty 1

## 2017-12-24 MED ORDER — HYDROCODONE-ACETAMINOPHEN 5-325 MG PO TABS: 1.0000 | ORAL_TABLET | ORAL | 0 refills | Status: DC | PRN

## 2017-12-24 MED ORDER — PANTOPRAZOLE SODIUM 40 MG PO TBEC: 40.0000 mg | DELAYED_RELEASE_TABLET | Freq: Every day | ORAL | 1 refills | Status: DC

## 2017-12-24 NOTE — ED Triage Notes (Signed)
Boyfriend states "it won't go away until she gets that shot of morphine"

## 2017-12-24 NOTE — Discharge Instructions (Addendum)
These call the number for GI medicine tomorrow morning to arrange a follow-up appointment as soon as possible.

## 2017-12-24 NOTE — ED Triage Notes (Signed)
Pt here for central chest pain and upper abdominal pain intermittent X 1 month.  Here yesterday for same.  Also reports vomiting.  Pt is hyperventilating in triage.  Chest xray not repeated as had one yesterday. CT scan also performed yesterday. Repeating labs and EKG

## 2017-12-24 NOTE — ED Provider Notes (Signed)
[facility] Emergency Department Provider Note  Time seen: 11:25 AM  I have reviewed the triage vital signs and the nursing notes.   HISTORY  Chief Complaint Chest Pain; Abdominal Pain; and Emesis    HPI Kayla Blanchard is a 37 y.o. female with a past medical history of COPD, MI, presents to the emergency department for upper abdominal/lower chest pain.  According to the patient for the past 2 months she has been experiencing intermittent pain which she describes as a severe burning sensation to the upper abdomen which radiates into the middle of the chest.  States over the past 2 to 3 days it has been constant and severe described as a sharp and burning pain.  Patient was seen in the emergency department yesterday for the same had a negative work-up and was ultimately discharged home with GI follow-up recommendations.  Patient states the pain had gone away however it returned and was even more severe this morning so she presented back to the emergency department.  States positive nausea vomiting occasional blood streaking.  Denies any lower abdominal pain denies any shortness of breath but does state nausea and diaphoresis.   Past Medical History:  Diagnosis Date  . COPD (chronic obstructive pulmonary disease) ([place])   . Gastric ulcer   . History of kidney infection   . MI (myocardial infarction) ([place])     There are no active problems to display for this patient.   History reviewed. No pertinent surgical history.  Prior to Admission medications   Medication Sig Start Date End Date Taking? Authorizing Provider  cyclobenzaprine (FLEXERIL) 5 MG tablet Take 1 tablet (5 mg total) by mouth 3 (three) times daily as needed for muscle spasms. 01/11/16   Drenda Freeze, MD  famotidine (PEPCID) 20 MG tablet Take 1 tablet (20 mg total) by mouth 2 (two) times daily. 11/18/17 11/18/18  Rudene Re, MD  ibuprofen (ADVIL,MOTRIN) 600 MG tablet Take 1 tablet (600 mg  total) by mouth every 6 (six) hours as needed. 01/11/16   Drenda Freeze, MD  ondansetron (ZOFRAN ODT) 4 MG disintegrating tablet Take 1 tablet (4 mg total) by mouth every 8 (eight) hours as needed for nausea or vomiting. 11/18/17   Rudene Re, MD  predniSONE (DELTASONE) 20 MG tablet Take 60 mg daily x 2 days then 40 mg daily x 2 days then 20 mg daily x 2 days 01/11/16   Drenda Freeze, MD  [facility] [address] [phone] Base) MCG/ACT AEPB Inhale 2 puffs into the lungs every 6 (six) hours as needed. 12/16/15   [provider]    Allergies  Allergen Reactions  . Amoxicillin Hives  . Banana   . Penicillins Hives    Has patient had a PCN reaction causing immediate rash, facial/tongue/throat swelling, SOB or lightheadedness with hypotension: Yes Has patient had a PCN reaction causing severe rash involving mucus membranes or skin necrosis: No Has patient had a PCN reaction that required hospitalization No Has patient had a PCN reaction occurring within the last 10 years: No If all of the above answers are "NO", then may proceed with Cephalosporin use.     History reviewed. No pertinent family history.  Social History Social History   Tobacco Use  . Smoking status: Current Every Day Smoker    Packs/day: 1.00    Types: Cigarettes  . Smokeless tobacco: Never Used  Substance Use Topics  . Alcohol use: Yes    Comment: occasional  . Drug  use: No    Review of Systems Constitutional: Negative for fever. Cardiovascular: Sharp/burning central chest pain Respiratory: Negative for shortness of breath. Gastrointestinal: Sharp/burning upper abdominal pain.  Positive nausea vomiting.  Genitourinary: Negative for urinary compaints.  Currently on her menstrual cycle. Musculoskeletal: Negative for musculoskeletal complaints Skin: Negative for skin complaints  Neurological: Negative for headache All other ROS negative  ____________________________________________   PHYSICAL  EXAM:  VITAL SIGNS: ED Triage Vitals  Enc Vitals Group     BP 12/24/17 0821 (!) 151/83     Pulse Rate 12/24/17 0821 94     Resp 12/24/17 0821 (!) 26     Temp 12/24/17 0821 97.9 F (36.6 C)     Temp Source 12/24/17 0821 Oral     SpO2 12/24/17 0821 100 %     Weight 12/24/17 0814 103 lb (46.7 kg)     Height 12/24/17 0814 5\' 2"  (1.575 m)     Head Circumference --      Peak Flow --      Pain Score 12/24/17 0814 10     Pain Loc --      Pain Edu? --      Excl. in [place]? --    Constitutional: Alert and oriented. Well appearing and in no distress. Eyes: Normal exam ENT   Head: Normocephalic and atraumatic.   Mouth/Throat: Mucous membranes are moist. Cardiovascular: Normal rate, regular rhythm. No murmur Respiratory: Normal respiratory effort without tachypnea nor retractions. Breath sounds are clear  Gastrointestinal: Soft, moderate epigastric tenderness palpation.  Abdomen otherwise benign without rebound guarding or distention. Musculoskeletal: Nontender with normal range of motion in all extremities.  Neurologic:  Normal speech and language. No gross focal neurologic deficits  Skin:  Skin is warm, dry and intact.  Psychiatric: Mood and affect are normal.   ____________________________________________    EKG  EKG reviewed and interpreted by myself shows a normal sinus rhythm at 91 bpm with a narrow QRS, right axis deviation, largely normal intervals with nonspecific but no concerning ST changes.  ____________________________________________    RADIOLOGY  CT scan the chest is negative  ____________________________________________   INITIAL IMPRESSION / ASSESSMENT AND PLAN / ED COURSE  Pertinent labs & imaging results that were available during my care of the patient were reviewed by me and considered in my medical decision making (see chart for details).  Patient presents the emergency department for severe upper abdominal pain/central chest pain.  Differential  would include ACS, pancreatitis, biliary colic, cholecystitis, gastric or peptic ulcers, gastritis, esophagitis.  Patient's work-up yesterday was largely within normal limits including chest x-ray, CT abdomen and pelvis.  States today the pain is more in her chest but also in the upper abdomen.  Vitals are overall reassuring.  Patient is in moderate distress occasionally screaming due to the pain.  We will dose pain medication, GI cocktail, IV hydrate.  Will obtain CT imaging of the chest to rule out pulmonary embolism/dissection.  If CT scan of the chest is negative we will attempt to treat symptomatically for likely GI related discomfort gastritis versus ulcerative disease.  Patient agreeable to plan of care.  Reassuringly patient's labs today are largely within normal limits as well with a slight leukocytosis of 12,900, LFTs are normal, lipase and troponin are normal.  CT scan of the chest is negative.  Labs are largely within normal limits.  Highly suspect gastritis.  Patient states the pain is improved after medicine and GI cocktail.  We will discharge with pain  medication, Protonix 40 mg daily, patient will follow-up with GI medicine.  I had a long discussion with the patient regarding diet.  Patient agreeable to plan of care.  ____________________________________________   FINAL CLINICAL IMPRESSION(S) / ED DIAGNOSES  Epigastric pain Chest pain Gastritis   Harvest Dark, MD 12/24/17 1330

## 2017-12-24 NOTE — ED Triage Notes (Signed)
Attempting EKG but pt nauseated and dry heaving

## 2018-02-12 ENCOUNTER — Encounter: Payer: Self-pay | Admitting: *Deleted

## 2018-02-13 ENCOUNTER — Encounter: Admission: RE | Payer: Self-pay | Source: Ambulatory Visit

## 2018-02-13 ENCOUNTER — Ambulatory Visit: Admission: RE | Admit: 2018-02-13 | Payer: Medicaid Other | Source: Ambulatory Visit | Admitting: Internal Medicine

## 2018-02-13 SURGERY — ESOPHAGOGASTRODUODENOSCOPY (EGD) WITH PROPOFOL
Anesthesia: General

## 2018-02-27 ENCOUNTER — Encounter: Payer: Self-pay | Admitting: Emergency Medicine

## 2018-02-27 ENCOUNTER — Emergency Department: Payer: Self-pay

## 2018-02-27 ENCOUNTER — Other Ambulatory Visit: Payer: Self-pay

## 2018-02-27 ENCOUNTER — Emergency Department
Admission: EM | Admit: 2018-02-27 | Discharge: 2018-02-27 | Disposition: A | Discharge status: Home / Self Care | Payer: Self-pay | Attending: Emergency Medicine | Admitting: Emergency Medicine

## 2018-02-27 DIAGNOSIS — Z79899 Other long term (current) drug therapy: Secondary | ICD-10-CM | POA: Insufficient documentation

## 2018-02-27 DIAGNOSIS — G510 Bell's palsy: Secondary | ICD-10-CM | POA: Insufficient documentation

## 2018-02-27 DIAGNOSIS — J449 Chronic obstructive pulmonary disease, unspecified: Secondary | ICD-10-CM | POA: Insufficient documentation

## 2018-02-27 DIAGNOSIS — F17210 Nicotine dependence, cigarettes, uncomplicated: Secondary | ICD-10-CM | POA: Insufficient documentation

## 2018-02-27 LAB — POCT PREGNANCY, URINE: Preg Test, Ur: NEGATIVE

## 2018-02-27 MED ORDER — PREDNISONE 10 MG PO TABS: ORAL_TABLET | ORAL | 0 refills | Status: DC

## 2018-02-27 MED ORDER — ACYCLOVIR 400 MG PO TABS: 400.0000 mg | ORAL_TABLET | Freq: Every day | ORAL | 0 refills | Status: AC

## 2018-02-27 NOTE — ED Triage Notes (Signed)
Woke up with right side face numb and doesn't work.  Says she went to bed about 930 pm.  She is ambulatory and has no other deficits.  Drove self here.

## 2018-02-27 NOTE — ED Provider Notes (Signed)
[facility] Emergency Department Provider Note  ____________________________________________   First MD Initiated Contact with Patient 02/27/18 1127     (approximate)  I have reviewed the triage vital signs and the nursing notes.   HISTORY  Chief Complaint Facial Droop   HPI Kayla Blanchard is a 37 y.o. female presents to the ED with complaint of right side facial numbness and decreased movement that was noticed when she woke up this morning.  Patient states that she went to bed approximately 9:30 PM without any difficulties.  She denies any visual changes, headache, nausea or vomiting.  Patient was able to drive herself to the ED without any difficulties.  She states that mostly she notices that her smile is crooked and that her eyelid  does not show it completely.   Past Medical History:  Diagnosis Date  . COPD (chronic obstructive pulmonary disease) ([place])   . Gastric ulcer   . History of kidney infection   . MI (myocardial infarction) ([place])     There are no active problems to display for this patient.   History reviewed. No pertinent surgical history.  Prior to Admission medications   Medication Sig Start Date End Date Taking? Authorizing Provider  acyclovir (ZOVIRAX) 400 MG tablet Take 1 tablet (400 mg total) by mouth 5 (five) times daily for 7 days. 02/27/18 03/06/18  Johnn Hai, PA-C  medroxyPROGESTERone (DEPO-PROVERA) 150 MG/ML injection Inject 150 mg into the muscle every 3 (three) months.    [provider]  predniSONE (DELTASONE) 10 MG tablet Take 6 tablets once a day for 5 days, then decrease by 1 tablet each day for the next 5 days. 02/27/18   Johnn Hai, PA-C  PROAIR RESPICLICK 601 (90 Base) MCG/ACT AEPB Inhale 2 puffs into the lungs every 6 (six) hours as needed. 12/16/15   [provider]  sucralfate (CARAFATE) 1 g tablet Take 1 g by mouth 4 (four) times daily -  with meals and at bedtime.    [provider]  Tiotropium Bromide-Olodaterol (STIOLTO RESPIMAT) 2.5-2.5 MCG/ACT AERS Inhale into the lungs.    [provider]    Allergies Amoxicillin; Banana; and Penicillins  No family history on file.  Social History Social History   Tobacco Use  . Smoking status: Current Every Day Smoker    Packs/day: 1.00    Types: Cigarettes  . Smokeless tobacco: Never Used  Substance Use Topics  . Alcohol use: Yes    Comment: occasional  . Drug use: No    Review of Systems Constitutional: No fever/chills Eyes: No visual changes.  Right eyelid drooping. ENT: No sore throat. Cardiovascular: Denies chest pain. Respiratory: Denies shortness of breath. Gastrointestinal: No abdominal pain.  No nausea, no vomiting.  No diarrhea.  Musculoskeletal: Negative for back pain. Skin: Negative for rash. Neurological: Negative for headaches.  Positive for right sided facial drooping. ___________________________________________   PHYSICAL EXAM:  VITAL SIGNS: ED Triage Vitals [02/27/18 1055]  Enc Vitals Group     BP (!) 141/91     Pulse Rate (!) 107     Resp 16     Temp 98.5 F (36.9 C)     Temp Source Oral     SpO2 100 %     Weight 104 lb (47.2 kg)     Height 5\' 2"  (1.575 m)     Head Circumference      Peak Flow      Pain Score 0  Pain Loc      Pain Edu?      Excl. in [place]?    Constitutional: Alert and oriented. Well appearing and in no acute distress. Eyes: Conjunctivae are normal. PERRL. EOMI. right eyelid does not close completely. Head: Atraumatic. Nose: No congestion/rhinnorhea. Mouth/Throat: Mucous membranes are moist.  Oropharynx non-erythematous. Neck: No stridor.  Nontender cervical spine to palpation posteriorly. Hematological/Lymphatic/Immunilogical: No cervical lymphadenopathy. Cardiovascular: Normal rate, regular rhythm. Grossly normal heart sounds.  Good peripheral circulation. Respiratory: Normal respiratory effort.  No retractions. Lungs  CTAB. Gastrointestinal: Soft and nontender. No distention.  Musculoskeletal: Moves upper and lower extremities without any difficulty.  Good muscle strength bilaterally.  Normal gait was noted. Neurologic:  Normal speech and language.  On examination of the right face there is deficit noted with flat nasal fold and inability to smile on the right and be symmetric.  Eyelid closes almost completely.  Decreased muscle strength on the right facial muscles.  Right forehead has been spared.  No gait instability.   Skin:  Skin is warm, dry and intact. No rash noted. Psychiatric: Mood and affect are normal. Speech and behavior are normal.  ____________________________________________   LABS (all labs ordered are listed, but only abnormal results are displayed)  Labs Reviewed  POC URINE PREG, ED  POCT PREGNANCY, URINE    RADIOLOGY  Official radiology report(s): Ct Head Wo Contrast  Result Date: 02/27/2018 CLINICAL DATA:  Right-sided partial facial paralysis over the last 12 hours EXAM: CT HEAD WITHOUT CONTRAST TECHNIQUE: Contiguous axial images were obtained from the base of the skull through the vertex without intravenous contrast. COMPARISON:  None. FINDINGS: Brain: The ventricular system is normal in size and configuration, and the septum is in a normal midline position. The fourth ventricle and basilar cisterns are unremarkable. No hemorrhage, mass lesion, or acute infarction is seen. Vascular: No vascular abnormality is noted on this unenhanced study. Skull: On bone window images, no calvarial abnormality is seen. Sinuses/Orbits: The paranasal sinuses that are visualized appear well pneumatized. Other: None. IMPRESSION: Negative unenhanced CT of the brain. Electronically Signed   By: Ivar Drape M.D.   On: 02/27/2018 12:14   ____________________________________________   PROCEDURES  Procedure(s) performed: None  Procedures  Critical Care performed:  No  ____________________________________________   INITIAL IMPRESSION / ASSESSMENT AND PLAN / ED COURSE  As part of my medical decision making, I reviewed the following data within the electronic MEDICAL RECORD NUMBER Notes from prior ED visits and [place] Controlled Substance Database  Patient presents to the ED with complaint of sudden onset of right facial numbness and decreased ability to close her eyelid or smile.  Patient states that she went to bed at approximately 9:30 PM and woke like this.  She denies any pain or headache.  Patient continues to ambulate without any assistance and drove herself to the ED.  She denies any previous problems such as this, no visual changes, no nausea or vomiting.  Physical exam is consistent with Bell's palsy and CT scan is negative for any acute changes.  We discussed Bell's palsy.  Patient is aware that she will be getting to prescriptions.  We discussed natural tears and closing her eye at night to prevent any dryness.  She will also follow-up with her PCP in approximately 5 to 6 days or sooner if any problems.  She is to return to the emergency department if any severe worsening or urgent concerns. ____________________________________________   FINAL CLINICAL IMPRESSION(S) / ED DIAGNOSES  Final diagnoses:  Right-sided Bell's palsy     ED Discharge Orders         Ordered    acyclovir (ZOVIRAX) 400 MG tablet  5 times daily     02/27/18 1302    predniSONE (DELTASONE) 10 MG tablet     02/27/18 1302           Note:  This document was prepared using Dragon voice recognition software and may include unintentional dictation errors.    Johnn Hai, PA-C 02/27/18 1324    Lavonia Drafts, MD 02/27/18 1346

## 2018-02-27 NOTE — Discharge Instructions (Signed)
Follow-up with your primary care provider if any continued problems and for recheck and approximately 5 to 6 days.  Begin taking medication as directed.  Acyclovir is 5 times a day for the next 7 days.  Prednisone is 6 tablets once a day for 5 days and then tapering down each day for 5 days. Use natural tears as we discussed to prevent your eye from drying out.  You may also want to use a patch to your eye while sleeping at night.  Wear glasses for eye protection.  Return to the emergency department if any severe worsening of your symptoms or sudden changes.

## 2018-05-26 ENCOUNTER — Encounter: Payer: Self-pay | Admitting: Emergency Medicine

## 2018-05-26 ENCOUNTER — Emergency Department: Payer: Self-pay

## 2018-05-26 ENCOUNTER — Emergency Department
Admission: EM | Admit: 2018-05-26 | Discharge: 2018-05-26 | Disposition: A | Discharge status: Home / Self Care | Payer: Self-pay | Attending: Emergency Medicine | Admitting: Emergency Medicine

## 2018-05-26 ENCOUNTER — Other Ambulatory Visit: Payer: Self-pay

## 2018-05-26 DIAGNOSIS — F17210 Nicotine dependence, cigarettes, uncomplicated: Secondary | ICD-10-CM | POA: Insufficient documentation

## 2018-05-26 DIAGNOSIS — J449 Chronic obstructive pulmonary disease, unspecified: Secondary | ICD-10-CM | POA: Insufficient documentation

## 2018-05-26 DIAGNOSIS — I252 Old myocardial infarction: Secondary | ICD-10-CM | POA: Insufficient documentation

## 2018-05-26 DIAGNOSIS — Z79899 Other long term (current) drug therapy: Secondary | ICD-10-CM | POA: Insufficient documentation

## 2018-05-26 DIAGNOSIS — K2900 Acute gastritis without bleeding: Secondary | ICD-10-CM | POA: Insufficient documentation

## 2018-05-26 LAB — CBC
HEMATOCRIT: 46.3 % — AB (ref 36.0–46.0)
Hemoglobin: 15.9 g/dL — ABNORMAL HIGH (ref 12.0–15.0)
MCH: 34 pg (ref 26.0–34.0)
MCHC: 34.3 g/dL (ref 30.0–36.0)
MCV: 98.9 fL (ref 80.0–100.0)
Platelets: 362 10*3/uL (ref 150–400)
RBC: 4.68 MIL/uL (ref 3.87–5.11)
RDW: 12.8 % (ref 11.5–15.5)
WBC: 15.1 10*3/uL — ABNORMAL HIGH (ref 4.0–10.5)
nRBC: 0 % (ref 0.0–0.2)

## 2018-05-26 LAB — URINALYSIS, COMPLETE (UACMP) WITH MICROSCOPIC
BACTERIA UA: NONE SEEN
Bilirubin Urine: NEGATIVE
Glucose, UA: 150 mg/dL — AB
Ketones, ur: 80 mg/dL — AB
Leukocytes,Ua: NEGATIVE
Nitrite: NEGATIVE
PROTEIN: 100 mg/dL — AB
Specific Gravity, Urine: 1.023 (ref 1.005–1.030)
pH: 6 (ref 5.0–8.0)

## 2018-05-26 LAB — COMPREHENSIVE METABOLIC PANEL
ALT: 16 U/L (ref 0–44)
AST: 27 U/L (ref 15–41)
Albumin: 5.3 g/dL — ABNORMAL HIGH (ref 3.5–5.0)
Alkaline Phosphatase: 70 U/L (ref 38–126)
Anion gap: 12 (ref 5–15)
BUN: 13 mg/dL (ref 6–20)
CO2: 24 mmol/L (ref 22–32)
Calcium: 9.5 mg/dL (ref 8.9–10.3)
Chloride: 100 mmol/L (ref 98–111)
Creatinine, Ser: 0.58 mg/dL (ref 0.44–1.00)
GFR calc Af Amer: >60 mL/min (ref >60)
GFR calc non Af Amer: >60 mL/min (ref >60)
Glucose, Bld: 163 mg/dL — ABNORMAL HIGH (ref 70–99)
Potassium: 3.5 mmol/L (ref 3.5–5.1)
Sodium: 136 mmol/L (ref 135–145)
Total Bilirubin: 0.9 mg/dL (ref 0.3–1.2)
Total Protein: 8.8 g/dL — ABNORMAL HIGH (ref 6.5–8.1)

## 2018-05-26 LAB — POCT PREGNANCY, URINE: PREG TEST UR: NEGATIVE

## 2018-05-26 LAB — LIPASE, BLOOD: Lipase: 27 U/L (ref 11–51)

## 2018-05-26 MED ORDER — SODIUM CHLORIDE 0.9% FLUSH: 3.0000 mL | Freq: Once | INTRAVENOUS | Status: DC

## 2018-05-26 MED ORDER — ONDANSETRON HCL 4 MG/2ML IJ SOLN
4.0000 mg | Freq: Once | INTRAMUSCULAR | Status: AC
  Administered 2018-05-26: 4 mg via INTRAVENOUS
  Filled 2018-05-26: qty 2

## 2018-05-26 MED ORDER — IOHEXOL 300 MG/ML  SOLN
75.0000 mL | Freq: Once | INTRAMUSCULAR | Status: AC | PRN
  Administered 2018-05-26: 75 mL via INTRAVENOUS

## 2018-05-26 MED ORDER — MORPHINE SULFATE (PF) 4 MG/ML IV SOLN
4.0000 mg | Freq: Once | INTRAVENOUS | Status: AC
  Administered 2018-05-26: 4 mg via INTRAVENOUS
  Filled 2018-05-26: qty 1

## 2018-05-26 MED ORDER — LIDOCAINE VISCOUS HCL 2 % MT SOLN
15.0000 mL | Freq: Once | OROMUCOSAL | Status: AC
  Administered 2018-05-26: 15 mL via ORAL
  Filled 2018-05-26: qty 15

## 2018-05-26 MED ORDER — PANTOPRAZOLE SODIUM 40 MG IV SOLR
40.0000 mg | Freq: Once | INTRAVENOUS | Status: AC
  Administered 2018-05-26: 40 mg via INTRAVENOUS
  Filled 2018-05-26: qty 40

## 2018-05-26 MED ORDER — PANTOPRAZOLE SODIUM 20 MG PO TBEC: 20.0000 mg | DELAYED_RELEASE_TABLET | Freq: Every day | ORAL | 1 refills | Status: DC

## 2018-05-26 MED ORDER — SUCRALFATE 1 G PO TABS: 1.0000 g | ORAL_TABLET | Freq: Four times a day (QID) | ORAL | 1 refills | Status: DC

## 2018-05-26 MED ORDER — ALUM & MAG HYDROXIDE-SIMETH 200-200-20 MG/5ML PO SUSP
30.0000 mL | Freq: Once | ORAL | Status: AC
  Administered 2018-05-26: 30 mL via ORAL
  Filled 2018-05-26: qty 30

## 2018-05-26 NOTE — ED Triage Notes (Signed)
Pt arrives POV to triage with c/o lower abdominal pain since 2000 last evening. Pt states that around x 2 months ago she was diagnosed with a stomach ulcer and she believes that "it feels like it ruptured".

## 2018-05-26 NOTE — ED Provider Notes (Signed)
[facility] Emergency Department Provider Note   ____________________________________________    I have reviewed the triage vital signs and the nursing notes.   HISTORY  Chief Complaint Abdominal Pain     HPI Kayla Blanchard is a 38 y.o. female who presents with complaints of severe epigastric pain.  Patient reports pain started yesterday at 8 PM and has been constant throughout the night she reports it is sharp in nature.  She thinks this is related to an ulcer.  Apparently she was seen in the emergency department 2 months ago and was told that she may have an ulcer and was asked to follow-up with GI but she has not done so.  She does not take anything for acid suppression.  No fevers or chills.  She reports that she thinks she may be developing diarrhea as well.  No sick contacts.  No recent travel.  Is not take anything for this.  Past Medical History:  Diagnosis Date  . COPD (chronic obstructive pulmonary disease) ([place])   . Gastric ulcer   . History of kidney infection   . MI (myocardial infarction) ([place])     There are no active problems to display for this patient.   History reviewed. No pertinent surgical history.  Prior to Admission medications   Medication Sig Start Date End Date Taking? Authorizing Provider  desvenlafaxine (PRISTIQ) 100 MG 24 hr tablet Take 100 mg by mouth daily. 04/08/18  Yes [provider]  medroxyPROGESTERone (DEPO-PROVERA) 150 MG/ML injection Inject 150 mg into the muscle every 3 (three) months.   Yes [provider]  PROAIR RESPICLICK 094 (90 Base) MCG/ACT AEPB Inhale 2 puffs into the lungs every 6 (six) hours as needed (shortness of breath / wheezing).  12/16/15  Yes [provider]  Tiotropium Bromide-Olodaterol (STIOLTO RESPIMAT) 2.5-2.5 MCG/ACT AERS Inhale 1 puff into the lungs daily.    Yes [provider]  pantoprazole (PROTONIX) 20 MG tablet Take 1 tablet (20 mg total) by  mouth daily. 05/26/18 05/26/19  Lavonia Drafts, MD  predniSONE (DELTASONE) 10 MG tablet Take 6 tablets once a day for 5 days, then decrease by 1 tablet each day for the next 5 days. Patient not taking: Reported on 05/26/2018 02/27/18   Johnn Hai, PA-C  sucralfate (CARAFATE) 1 g tablet Take 1 tablet (1 g total) by mouth 4 (four) times daily for 30 days. 05/26/18 06/25/18  Lavonia Drafts, MD     Allergies Amoxicillin; Banana; and Penicillins  No family history on file.  Social History Social History   Tobacco Use  . Smoking status: Current Every Day Smoker    Packs/day: 1.00    Types: Cigarettes  . Smokeless tobacco: Never Used  Substance Use Topics  . Alcohol use: Yes    Comment: occasional  . Drug use: No    Review of Systems  Constitutional: No fever/chills Eyes: No visual changes.  ENT: No sore throat. Cardiovascular: Denies chest pain. Respiratory: Denies shortness of breath. Gastrointestinal: As above Genitourinary: Negative for dysuria. Musculoskeletal: Negative for back pain. Skin: Negative for rash. Neurological: Negative for headaches    ____________________________________________   PHYSICAL EXAM:  VITAL SIGNS: ED Triage Vitals  Enc Vitals Group     BP 05/26/18 0639 (!) 174/108     Pulse Rate 05/26/18 0639 (!) 101     Resp 05/26/18 0639 18     Temp 05/26/18 0639 (!) 97.5 F (36.4 C)     Temp Source 05/26/18  5361 Oral     SpO2 05/26/18 0639 100 %     Weight 05/26/18 0640 46.7 kg (103 lb)     Height 05/26/18 0640 1.575 m (5\' 2" )     Head Circumference --      Peak Flow --      Pain Score 05/26/18 0639 10     Pain Loc --      Pain Edu? --      Excl. in [place]? --    Constitutional: Alert and oriented.  Anxious Eyes: Conjunctivae are normal.   Nose: No congestion/rhinnorhea. Mouth/Throat: Mucous membranes are moist.    Cardiovascular: Normal rate, regular rhythm. Grossly normal heart sounds.  Good peripheral circulation. Respiratory: Normal  respiratory effort.  No retractions. Lungs CTAB. Gastrointestinal: Mild tenderness epigastrically, no significant distention Musculoskeletal:   Warm and well perfused Neurologic:  Normal speech and language. No gross focal neurologic deficits are appreciated.  Skin:  Skin is warm, dry and intact. No rash noted. Psychiatric: Mood and affect are normal. Speech and behavior are normal.  ____________________________________________   LABS (all labs ordered are listed, but only abnormal results are displayed)  Labs Reviewed  COMPREHENSIVE METABOLIC PANEL - Abnormal; Notable for the following components:      Result Value   Glucose, Bld 163 (*)    Total Protein 8.8 (*)    Albumin 5.3 (*)    All other components within normal limits  CBC - Abnormal; Notable for the following components:   WBC 15.1 (*)    Hemoglobin 15.9 (*)    HCT 46.3 (*)    All other components within normal limits  URINALYSIS, COMPLETE (UACMP) WITH MICROSCOPIC - Abnormal; Notable for the following components:   Color, Urine YELLOW (*)    APPearance CLEAR (*)    Glucose, UA 150 (*)    Hgb urine dipstick SMALL (*)    Ketones, ur 80 (*)    Protein, ur 100 (*)    All other components within normal limits  LIPASE, BLOOD  POC URINE PREG, ED  POCT PREGNANCY, URINE   ____________________________________________  EKG  ED ECG REPORT I, Lavonia Drafts, the attending physician, personally viewed and interpreted this ECG.  Date: 05/26/2018  Rhythm: normal sinus rhythm QRS Axis: normal Intervals: normal ST/T Wave abnormalities: normal Narrative Interpretation: no evidence of acute ischemia  ____________________________________________  RADIOLOGY  Acute abdomen unremarkable  CT abdomen pelvis unremarkable ____________________________________________   PROCEDURES  Procedure(s) performed: No  Procedures   Critical Care performed: No ____________________________________________   INITIAL IMPRESSION /  ASSESSMENT AND PLAN / ED COURSE  Pertinent labs & imaging results that were available during my care of the patient were reviewed by me and considered in my medical decision making (see chart for details).  Patient presents with epigastric discomfort, mild tenderness to palpation.  Differential includes gastritis, PUD, pancreatitis, cholecystitis.  Will treat with IV morphine, IV Zofran, obtain abdomen x-ray given her concern of possible ruptured ulcer  Abdomen x-ray unremarkable however the patient continues to have significant pain additional dose of IV morphine given, CT abdomen obtained  CT scan is reassuring.  The patient is feeling much better.  We will treat for likely gastritis/PUD with PPI, sucralfate, close follow-up with GI.    ____________________________________________   FINAL CLINICAL IMPRESSION(S) / ED DIAGNOSES  Final diagnoses:  Acute gastritis without hemorrhage, unspecified gastritis type        Note:  This document was prepared using Dragon voice recognition software and may include unintentional  dictation errors.   Lavonia Drafts, MD 05/26/18 1131

## 2018-11-19 ENCOUNTER — Emergency Department: Payer: Medicaid Other

## 2018-11-19 ENCOUNTER — Inpatient Hospital Stay
Admission: EM | Admit: 2018-11-19 | Discharge: 2018-11-21 | DRG: 378 | Disposition: A | Discharge status: Home / Self Care | Payer: Medicaid Other | Attending: Internal Medicine | Admitting: Internal Medicine

## 2018-11-19 ENCOUNTER — Other Ambulatory Visit: Payer: Self-pay

## 2018-11-19 ENCOUNTER — Encounter: Payer: Self-pay | Admitting: Emergency Medicine

## 2018-11-19 DIAGNOSIS — Z79899 Other long term (current) drug therapy: Secondary | ICD-10-CM | POA: Diagnosis not present

## 2018-11-19 DIAGNOSIS — K219 Gastro-esophageal reflux disease without esophagitis: Secondary | ICD-10-CM | POA: Diagnosis present

## 2018-11-19 DIAGNOSIS — K922 Gastrointestinal hemorrhage, unspecified: Secondary | ICD-10-CM

## 2018-11-19 DIAGNOSIS — I252 Old myocardial infarction: Secondary | ICD-10-CM

## 2018-11-19 DIAGNOSIS — I2510 Atherosclerotic heart disease of native coronary artery without angina pectoris: Secondary | ICD-10-CM | POA: Diagnosis present

## 2018-11-19 DIAGNOSIS — J449 Chronic obstructive pulmonary disease, unspecified: Secondary | ICD-10-CM | POA: Diagnosis present

## 2018-11-19 DIAGNOSIS — Z23 Encounter for immunization: Secondary | ICD-10-CM

## 2018-11-19 DIAGNOSIS — F1010 Alcohol abuse, uncomplicated: Secondary | ICD-10-CM | POA: Diagnosis present

## 2018-11-19 DIAGNOSIS — Z20828 Contact with and (suspected) exposure to other viral communicable diseases: Secondary | ICD-10-CM | POA: Diagnosis present

## 2018-11-19 DIAGNOSIS — K2971 Gastritis, unspecified, with bleeding: Secondary | ICD-10-CM | POA: Diagnosis present

## 2018-11-19 DIAGNOSIS — R778 Other specified abnormalities of plasma proteins: Secondary | ICD-10-CM | POA: Diagnosis present

## 2018-11-19 DIAGNOSIS — F17210 Nicotine dependence, cigarettes, uncomplicated: Secondary | ICD-10-CM | POA: Diagnosis present

## 2018-11-19 DIAGNOSIS — K920 Hematemesis: Secondary | ICD-10-CM | POA: Diagnosis present

## 2018-11-19 DIAGNOSIS — K2900 Acute gastritis without bleeding: Secondary | ICD-10-CM | POA: Diagnosis not present

## 2018-11-19 DIAGNOSIS — E876 Hypokalemia: Secondary | ICD-10-CM | POA: Diagnosis present

## 2018-11-19 DIAGNOSIS — I248 Other forms of acute ischemic heart disease: Secondary | ICD-10-CM | POA: Diagnosis present

## 2018-11-19 LAB — COMPREHENSIVE METABOLIC PANEL
ALT: 26 U/L (ref 0–44)
AST: 32 U/L (ref 15–41)
Albumin: 4.8 g/dL (ref 3.5–5.0)
Alkaline Phosphatase: 61 U/L (ref 38–126)
Anion gap: 15 (ref 5–15)
BUN: 19 mg/dL (ref 6–20)
CO2: 25 mmol/L (ref 22–32)
Calcium: 9.3 mg/dL (ref 8.9–10.3)
Chloride: 93 mmol/L — ABNORMAL LOW (ref 98–111)
Creatinine, Ser: 0.71 mg/dL (ref 0.44–1.00)
GFR calc Af Amer: >60 mL/min (ref >60)
GFR calc non Af Amer: >60 mL/min (ref >60)
Glucose, Bld: 135 mg/dL — ABNORMAL HIGH (ref 70–99)
Potassium: 3.4 mmol/L — ABNORMAL LOW (ref 3.5–5.1)
Sodium: 133 mmol/L — ABNORMAL LOW (ref 135–145)
Total Bilirubin: 0.9 mg/dL (ref 0.3–1.2)
Total Protein: 7.9 g/dL (ref 6.5–8.1)

## 2018-11-19 LAB — TYPE AND SCREEN
ABO/RH(D): A POS
Antibody Screen: NEGATIVE

## 2018-11-19 LAB — TROPONIN I (HIGH SENSITIVITY)
Troponin I (High Sensitivity): 135 ng/L (ref <18)
Troponin I (High Sensitivity): 129 ng/L (ref <18)

## 2018-11-19 LAB — PROTIME-INR
INR: 1 (ref 0.8–1.2)
Prothrombin Time: 12.6 seconds (ref 11.4–15.2)

## 2018-11-19 LAB — CBC
HCT: 50.4 % — ABNORMAL HIGH (ref 36.0–46.0)
Hemoglobin: 17.8 g/dL — ABNORMAL HIGH (ref 12.0–15.0)
MCH: 34.4 pg — ABNORMAL HIGH (ref 26.0–34.0)
MCHC: 35.3 g/dL (ref 30.0–36.0)
MCV: 97.3 fL (ref 80.0–100.0)
Platelets: 364 10*3/uL (ref 150–400)
RBC: 5.18 MIL/uL — ABNORMAL HIGH (ref 3.87–5.11)
RDW: 11.6 % (ref 11.5–15.5)
WBC: 10.7 10*3/uL — ABNORMAL HIGH (ref 4.0–10.5)
nRBC: 0 % (ref 0.0–0.2)

## 2018-11-19 LAB — SARS CORONAVIRUS 2 BY RT PCR (HOSPITAL ORDER, PERFORMED IN ~~LOC~~ HOSPITAL LAB): SARS Coronavirus 2: NEGATIVE

## 2018-11-19 LAB — ETHANOL: Alcohol, Ethyl (B): <10 mg/dL (ref <10)

## 2018-11-19 LAB — APTT: aPTT: 31 seconds (ref 24–36)

## 2018-11-19 MED ORDER — SODIUM CHLORIDE 0.9 % IV SOLN
80.0000 mg | Freq: Once | INTRAVENOUS | Status: AC
  Administered 2018-11-19: 80 mg via INTRAVENOUS
  Filled 2018-11-19: qty 80

## 2018-11-19 MED ORDER — METOCLOPRAMIDE HCL 5 MG/ML IJ SOLN
10.0000 mg | Freq: Once | INTRAMUSCULAR | Status: AC
  Administered 2018-11-19: 17:00:00 10 mg via INTRAVENOUS
  Filled 2018-11-19: qty 2

## 2018-11-19 MED ORDER — ONDANSETRON HCL 4 MG/2ML IJ SOLN
4.0000 mg | Freq: Once | INTRAMUSCULAR | Status: AC
  Administered 2018-11-19: 17:00:00 4 mg via INTRAVENOUS
  Filled 2018-11-19: qty 2

## 2018-11-19 MED ORDER — TRAZODONE HCL 50 MG PO TABS
50.0000 mg | ORAL_TABLET | Freq: Every day | ORAL | Status: DC
  Administered 2018-11-19 – 2018-11-20 (×2): 50 mg via ORAL
  Filled 2018-11-19 (×2): qty 1

## 2018-11-19 MED ORDER — SODIUM CHLORIDE 0.9 % IV BOLUS
1000.0000 mL | Freq: Once | INTRAVENOUS | Status: AC
  Administered 2018-11-19: 1000 mL via INTRAVENOUS

## 2018-11-19 MED ORDER — METHOCARBAMOL 500 MG PO TABS
500.0000 mg | ORAL_TABLET | Freq: Three times a day (TID) | ORAL | Status: DC | PRN
  Administered 2018-11-20 – 2018-11-21 (×3): 500 mg via ORAL
  Filled 2018-11-19 (×5): qty 1

## 2018-11-19 MED ORDER — NICOTINE 14 MG/24HR TD PT24
14.0000 mg | MEDICATED_PATCH | Freq: Every day | TRANSDERMAL | Status: DC
  Administered 2018-11-19 – 2018-11-20 (×2): 14 mg via TRANSDERMAL
  Filled 2018-11-19 (×2): qty 1

## 2018-11-19 NOTE — ED Notes (Signed)
ED TO INPATIENT HANDOFF REPORT  ED Nurse Name and Phone #: Kirke Shaggy [phone]  S Name/Age/Gender Kayla Blanchard 38 y.o. female Room/Bed: ED15A/ED15A  Code Status   Code Status: Not on file  Home/SNF/Other Home Patient oriented to: self, place, time and situation Is this baseline? Yes   Triage Complete: Triage complete  Chief Complaint Vomiting blood  Triage Note FIRST NURSE NOTE-pt reports vomiting blood intermittent since Thursday. Hx of ETOH, quit two weeks ago.  Alert and oriented at check in.    Allergies Allergies  Allergen Reactions  . Amoxicillin Hives  . Banana   . Penicillins Hives    Has patient had a PCN reaction causing immediate rash, facial/tongue/throat swelling, SOB or lightheadedness with hypotension: Yes Has patient had a PCN reaction causing severe rash involving mucus membranes or skin necrosis: No Has patient had a PCN reaction that required hospitalization No Has patient had a PCN reaction occurring within the last 10 years: No If all of the above answers are "NO", then may proceed with Cephalosporin use.     Level of Care/Admitting Diagnosis ED Disposition    ED Disposition Condition Comment   Admit  The patient appears reasonably stabilized for admission considering the current resources, flow, and capabilities available in the ED at this time, and I doubt any other [facility] requiring further screening and/or treatment in the ED prior to admission is  present.       B Medical/Surgery History Past Medical History:  Diagnosis Date  . COPD (chronic obstructive pulmonary disease) ([place])   . Gastric ulcer   . History of kidney infection   . MI (myocardial infarction) [facility])    Past Surgical History:  Procedure Laterality Date  . NO PAST SURGERIES       A IV Location/Drains/Wounds Patient Lines/Drains/Airways Status   Active Line/Drains/Airways    Name:   Placement date:   Placement time:   Site:   Days:   Peripheral IV 11/19/18 Left  Antecubital   11/19/18    1712    Antecubital   less than 1          Intake/Output Last 24 hours No intake or output data in the 24 hours ending 11/19/18 2041  Labs/Imaging Results for orders placed or performed during the hospital encounter of 11/19/18 (from the past 48 hour(s))  Comprehensive metabolic panel     Status: Abnormal   Collection Time: 11/19/18  4:56 PM  Result Value Ref Range   Sodium 133 (L) 135 - 145 mmol/L   Potassium 3.4 (L) 3.5 - 5.1 mmol/L   Chloride 93 (L) 98 - 111 mmol/L   CO2 25 22 - 32 mmol/L   Glucose, Bld 135 (H) 70 - 99 mg/dL   BUN 19 6 - 20 mg/dL   Creatinine, Ser 0.71 0.44 - 1.00 mg/dL   Calcium 9.3 8.9 - 10.3 mg/dL   Total Protein 7.9 6.5 - 8.1 g/dL   Albumin 4.8 3.5 - 5.0 g/dL   AST 32 15 - 41 U/L   ALT 26 0 - 44 U/L   Alkaline Phosphatase 61 38 - 126 U/L   Total Bilirubin 0.9 0.3 - 1.2 mg/dL   GFR calc non Af Amer >60 >60 mL/min   GFR calc Af Amer >60 >60 mL/min   Anion gap 15 5 - 15    Comment: Performed at [facility], [address] [place], [place] [address]  CBC     Status: Abnormal   Collection Time:  11/19/18  4:56 PM  Result Value Ref Range   WBC 10.7 (H) 4.0 - 10.5 K/uL   RBC 5.18 (H) 3.87 - 5.11 MIL/uL   Hemoglobin 17.8 (H) 12.0 - 15.0 g/dL   HCT 50.4 (H) 36.0 - 46.0 %   MCV 97.3 80.0 - 100.0 fL   MCH 34.4 (H) 26.0 - 34.0 pg   MCHC 35.3 30.0 - 36.0 g/dL   RDW 11.6 11.5 - 15.5 %   Platelets 364 150 - 400 K/uL   nRBC 0.0 0.0 - 0.2 %    Comment: Performed at [facility], [address] [place], [place] [address]  Type and screen [place]     Status: None   Collection Time: 11/19/18  4:56 PM  Result Value Ref Range   ABO/RH(D) A POS    Antibody Screen NEG    Sample Expiration      11/22/2018,2359 Performed at [facility], [address] [place], [place] [address]   Protime-INR     Status: None   Collection Time: 11/19/18  5:21 PM  Result Value Ref Range    Prothrombin Time 12.6 11.4 - 15.2 seconds   INR 1.0 0.8 - 1.2    Comment: (NOTE) INR goal varies based on device and disease states. Performed at [facility], [address] [place], [place] [address]   APTT     Status: None   Collection Time: 11/19/18  5:21 PM  Result Value Ref Range   aPTT 31 24 - 36 seconds    Comment: Performed at [facility], [place], [place] [address]  Troponin I (High Sensitivity)     Status: Abnormal   Collection Time: 11/19/18  5:21 PM  Result Value Ref Range   Troponin I (High Sensitivity) 135 (HH) <18 ng/L    Comment: CRITICAL RESULT CALLED TO, READ BACK BY AND VERIFIED WITH  Lanina Larranaga ON 11/19/2018 AT 1759 TIK (NOTE) Elevated high sensitivity troponin I (hsTnI) values and significant  changes across serial measurements may suggest ACS but many other  chronic and acute conditions are known to elevate hsTnI results.  Refer to the "Links" section for chest pain algorithms and additional  guidance. Performed at [facility], [place], [place] [address]   Troponin I (High Sensitivity)     Status: Abnormal   Collection Time: 11/19/18  7:17 PM  Result Value Ref Range   Troponin I (High Sensitivity) 129 (HH) <18 ng/L    Comment: CRITICAL VALUE NOTED. VALUE IS CONSISTENT WITH PREVIOUSLY REPORTED/CALLED VALUE TIK (NOTE) Elevated high sensitivity troponin I (hsTnI) values and significant  changes across serial measurements may suggest ACS but many other  chronic and acute conditions are known to elevate hsTnI results.  Refer to the "Links" section for chest pain algorithms and additional  guidance. Performed at [facility], [address] [place], [place] [address]   SARS Coronavirus 2 [facility] order, Performed in [facility] hospital lab) Nasopharyngeal Nasopharyngeal Swab     Status: None   Collection Time: 11/19/18  7:21 PM   Specimen: Nasopharyngeal Swab  Result Value  Ref Range   SARS Coronavirus 2 NEGATIVE NEGATIVE    Comment: (NOTE) If result is NEGATIVE SARS-CoV-2 target nucleic acids are NOT DETECTED. The SARS-CoV-2 RNA is generally detectable in upper and lower  respiratory specimens during the acute phase of infection. The lowest  concentration of SARS-CoV-2 viral copies this assay can detect is  250  copies / mL. A negative result does not preclude SARS-CoV-2 infection  and should not be used as the sole basis for treatment or other  patient management decisions.  A negative result may occur with  improper specimen collection / handling, submission of specimen other  than nasopharyngeal swab, presence of viral mutation(s) within the  areas targeted by this assay, and inadequate number of viral copies  (<250 copies / mL). A negative result must be combined with clinical  observations, patient history, and epidemiological information. If result is POSITIVE SARS-CoV-2 target nucleic acids are DETECTED. The SARS-CoV-2 RNA is generally detectable in upper and lower  respiratory specimens dur ing the acute phase of infection.  Positive  results are indicative of active infection with SARS-CoV-2.  Clinical  correlation with patient history and other diagnostic information is  necessary to determine patient infection status.  Positive results do  not rule out bacterial infection or co-infection with other viruses. If result is PRESUMPTIVE POSTIVE SARS-CoV-2 nucleic acids MAY BE PRESENT.   A presumptive positive result was obtained on the submitted specimen  and confirmed on repeat testing.  While 2019 novel coronavirus  (SARS-CoV-2) nucleic acids may be present in the submitted sample  additional confirmatory testing may be necessary for epidemiological  and / or clinical management purposes  to differentiate between  SARS-CoV-2 and other Sarbecovirus currently known to infect humans.  If clinically indicated additional testing with an alternate  test  methodology [id number]) is advised. The SARS-CoV-2 RNA is generally  detectable in upper and lower respiratory sp ecimens during the acute  phase of infection. The expected result is Negative. Fact Sheet for Patients:  StrictlyIdeas.no Fact Sheet for Healthcare Providers: BankingDealers.co.za This test is not yet approved or cleared by the [place] FDA and has been authorized for detection and/or diagnosis of SARS-CoV-2 by FDA under an Emergency Use Authorization (EUA).  This EUA will remain in effect (meaning this test can be used) for the duration of the COVID-19 declaration under Section 564(b)(1) of the Act, 21 U.S.C. section 360bbb-3(b)(1), unless the authorization is terminated or revoked sooner. Performed at [facility], [address] [place], [place] [address]    Dg Chest Portable 1 View  Result Date: 11/19/2018 CLINICAL DATA:  Intermittent vomiting for the past 4 days. Smoker. Quit drinking alcohol 2 weeks ago. EXAM: PORTABLE CHEST 1 VIEW COMPARISON:  12/23/2017 FINDINGS: Normal sized heart. Clear lungs. The lungs remain mildly hyperexpanded with mild peribronchial thickening. No free peritoneal air. Unremarkable bones. IMPRESSION: Stable mild changes of COPD and chronic bronchitis. No acute abnormality. Electronically Signed   By: Claudie Revering M.D.   On: 11/19/2018 17:37    Pending Labs Unresulted Labs (From admission, onward)    Start     Ordered   11/19/18 2038  Urine Drug Screen, Qualitative ([place] only)  ONCE - STAT,   STAT     11/19/18 2037   11/19/18 2038  Urinalysis, Complete w Microscopic  ONCE - STAT,   STAT     11/19/18 2037   11/19/18 2037  Ethanol  Add-on,   AD     11/19/18 2036          Vitals/Pain Today's Vitals   11/19/18 1800 11/19/18 1900 11/19/18 1930 11/19/18 2000  BP: (!) 129/102 (!) 134/106 (!) 119/97 (!) 121/101  Pulse: (!) 120 (!) 118 (!) 123 (!) 119  Resp: (!) 24 (!) 23 13  15   Temp:      TempSrc:  SpO2: 98% 97% 100% 100%  Weight:      PainSc:        Isolation Precautions No active isolations  Medications Medications  ondansetron (ZOFRAN) injection 4 mg (4 mg Intravenous Given 11/19/18 1722)  metoCLOPramide (REGLAN) injection 10 mg (10 mg Intravenous Given 11/19/18 1722)  pantoprazole (PROTONIX) 80 mg in sodium chloride 0.9 % 100 mL IVPB (0 mg Intravenous Stopped 11/19/18 1916)  sodium chloride 0.9 % bolus 1,000 mL (0 mLs Intravenous Stopped 11/19/18 2021)    Mobility walks Low fall risk   Focused Assessments Cardiac Assessment Handoff:    Lab Results  Component Value Date   TROPONINI <0.03 12/24/2017   No results found for: DDIMER Does the Patient currently have chest pain? No     R Recommendations: See Admitting Provider Note  Report given to:   Additional Notes:

## 2018-11-19 NOTE — ED Notes (Signed)
CRITICAL TROPONIN VALUE REVIEWED WITH MD Jari Pigg

## 2018-11-19 NOTE — ED Triage Notes (Signed)
FIRST NURSE NOTE-pt reports vomiting blood intermittent since Thursday. Hx of ETOH, quit two weeks ago.  Alert and oriented at check in.

## 2018-11-19 NOTE — ED Notes (Signed)
MD Jannifer Franklin at bedside, patient given approval to have water

## 2018-11-19 NOTE — ED Provider Notes (Signed)
[facility] Emergency Department Provider Note  ____________________________________________   First MD Initiated Contact with Patient 11/19/18 1710     (approximate)  I have reviewed the triage vital signs and the nursing notes.   HISTORY  Chief Complaint Hematemesis    HPI Kayla Blanchard is a 38 y.o. female with alcohol use who presents with hematemesis.  Patient had off-and-on hematemesis for the past 4 days.  Intermittent, nothing makes it better, nothing makes it worse.  Associated with some epigastric abdominal pain.  She has been sober from alcohol for 14 days.  Denies a history of varices. She has had prior Ulcer.  Prior MI reported but sounds like cath was clean?  No stents placed per patient.         Past Medical History:  Diagnosis Date  . COPD (chronic obstructive pulmonary disease) ([place])   . Gastric ulcer   . History of kidney infection   . MI (myocardial infarction) ([place])     There are no active problems to display for this patient.   History reviewed. No pertinent surgical history.  Prior to Admission medications   Medication Sig Start Date End Date Taking? Authorizing Provider  desvenlafaxine (PRISTIQ) 100 MG 24 hr tablet Take 100 mg by mouth daily. 04/08/18   [provider]  medroxyPROGESTERone (DEPO-PROVERA) 150 MG/ML injection Inject 150 mg into the muscle every 3 (three) months.    [provider]  pantoprazole (PROTONIX) 20 MG tablet Take 1 tablet (20 mg total) by mouth daily. 05/26/18 05/26/19  Lavonia Drafts, MD  predniSONE (DELTASONE) 10 MG tablet Take 6 tablets once a day for 5 days, then decrease by 1 tablet each day for the next 5 days. Patient not taking: Reported on 05/26/2018 02/27/18   Johnn Hai, PA-C  PROAIR RESPICLICK [id number] [phone] Base) MCG/ACT AEPB Inhale 2 puffs into the lungs every 6 (six) hours as needed (shortness of breath / wheezing).  12/16/15   [provider]  sucralfate  (CARAFATE) 1 g tablet Take 1 tablet (1 g total) by mouth 4 (four) times daily for 30 days. 05/26/18 06/25/18  Lavonia Drafts, MD  Tiotropium Bromide-Olodaterol (STIOLTO RESPIMAT) 2.5-2.5 MCG/ACT AERS Inhale 1 puff into the lungs daily.     [provider]    Allergies Amoxicillin, Banana, and Penicillins  No family history on file.  Social History Social History   Tobacco Use  . Smoking status: Current Every Day Smoker    Packs/day: 1.00    Types: Cigarettes  . Smokeless tobacco: Never Used  Substance Use Topics  . Alcohol use: Not Currently    Comment: occasional - stopped drinking 11/05/2018  . Drug use: No      Review of Systems Constitutional: No fever/chills Eyes: No visual changes. ENT: No sore throat. Cardiovascular: Denies chest pain. Respiratory: Denies shortness of breath. Gastrointestinal: + abd pain.  +hematemesis  Genitourinary: Negative for dysuria. Musculoskeletal: Negative for back pain. Skin: Negative for rash. Neurological: Negative for headaches, focal weakness or numbness. All other ROS negative ____________________________________________   PHYSICAL EXAM:  VITAL SIGNS: ED Triage Vitals  Enc Vitals Group     BP 11/19/18 1648 (!) 135/95     Pulse Rate 11/19/18 1648 (!) 128     Resp 11/19/18 1648 20     Temp 11/19/18 1648 98.5 F (36.9 C)     Temp Source 11/19/18 1648 Oral     SpO2 11/19/18 1648 98 %     Weight 11/19/18  1645 102 lb 15.3 oz (46.7 kg)     Height --      Head Circumference --      Peak Flow --      Pain Score 11/19/18 1645 10     Pain Loc --      Pain Edu? --      Excl. in [place]? --     Constitutional: Alert and oriented. Well appearing and in no acute distress. Eyes: Conjunctivae are normal. EOMI. Head: Atraumatic. Nose: No congestion/rhinnorhea. Mouth/Throat: Mucous membranes are moist.   No crepitus on the neck.  Neck: No stridor. Trachea Midline. FROM Cardiovascular:tachycardiac, regular rhythm. Grossly normal  heart sounds.  Good peripheral circulation. Respiratory: Normal respiratory effort.  No retractions. Lungs CTAB. Gastrointestinal: Soft but tender in epigastric region. No distention. No abdominal bruits.  Musculoskeletal: No lower extremity tenderness nor edema.  No joint effusions. Neurologic:  Normal speech and language. No gross focal neurologic deficits are appreciated.  Skin:  Skin is warm, dry and intact. No rash noted. Psychiatric: Mood and affect are normal. Speech and behavior are normal. GU: Deferred   ____________________________________________   LABS (all labs ordered are listed, but only abnormal results are displayed)  Labs Reviewed  COMPREHENSIVE METABOLIC PANEL - Abnormal; Notable for the following components:      Result Value   Sodium 133 (*)    Potassium 3.4 (*)    Chloride 93 (*)    Glucose, Bld 135 (*)    All other components within normal limits  CBC - Abnormal; Notable for the following components:   WBC 10.7 (*)    RBC 5.18 (*)    Hemoglobin 17.8 (*)    HCT 50.4 (*)    MCH 34.4 (*)    All other components within normal limits  TROPONIN I (HIGH SENSITIVITY) - Abnormal; Notable for the following components:   Troponin I (High Sensitivity) 135 (*)    All other components within normal limits  TROPONIN I (HIGH SENSITIVITY) - Abnormal; Notable for the following components:   Troponin I (High Sensitivity) 129 (*)    All other components within normal limits  SARS CORONAVIRUS 2 (HOSPITAL ORDER, [place] [facility])  PROTIME-INR  APTT  POC OCCULT BLOOD, ED  POC URINE PREG, ED  POC URINE PREG, ED  TYPE AND SCREEN   ____________________________________________   ED ECG REPORT I, Vanessa [place], the attending physician, personally viewed and interpreted this ECG.  EKG is sinus tachycardia rate of 125, no ST elevation, no T wave inversion, QTC of 464 ____________________________________________  RADIOLOGY Robert Bellow, personally  viewed and evaluated these images (plain radiographs) as part of my medical decision making, as well as reviewing the written report by the radiologist.  ED MD interpretation: Chest x-ray with no free air   Official radiology report(s): Dg Chest Portable 1 View  Result Date: 11/19/2018 CLINICAL DATA:  Intermittent vomiting for the past 4 days. Smoker. Quit drinking alcohol 2 weeks ago. EXAM: PORTABLE CHEST 1 VIEW COMPARISON:  12/23/2017 FINDINGS: Normal sized heart. Clear lungs. The lungs remain mildly hyperexpanded with mild peribronchial thickening. No free peritoneal air. Unremarkable bones. IMPRESSION: Stable mild changes of COPD and chronic bronchitis. No acute abnormality. Electronically Signed   By: Claudie Revering M.D.   On: 11/19/2018 17:37    ____________________________________________   PROCEDURES  Procedure(s) performed (including Critical Care):  Procedures   ____________________________________________   INITIAL IMPRESSION / ASSESSMENT AND PLAN / ED COURSE  Kayla Blanchard was evaluated in Emergency Department on 11/19/2018 for the symptoms described in the history of present illness. She was evaluated in the context of the global COVID-19 pandemic, which necessitated consideration that the patient might be at risk for infection with the SARS-CoV-2 virus that causes COVID-19. Institutional protocols and algorithms that pertain to the evaluation of patients at risk for COVID-19 are in a state of rapid change based on information released by regulatory bodies including the CDC and federal and state organizations. These policies and algorithms were followed during the patient's care in the ED.    Patient presents with amount of emesis most likely secondary to ulcers.  Patient has a history of alcohol use but her prior endoscopy not show varices per patient.  Patient notably tachycardic.  Will get labs to evaluate for ACS, anemia, electrolyte abnormalities.  Patient does have some  epigastric tenderness would be consistent with ulcer.  No rebound or guarding to just perforation but will get x-ray to to evaluate.  No crepitus in her neck to suggest boerhaave  And patient is not febrile or septic looking.  Will give patient 1 L of fluids, Reglan and Zofran.  Hemoglobin is stable.  LFTs are normal.  Troponin is 135.  Patient denies any chest pain at this time.  We will hold off on heparin given the concern for GI bleeding and repeat troponin.  Most likely demand in the setting of her tachycardia and vomiting.  Reevaluated patient.  Patient is feeling much better.  Her abdomen is soft and nontender.  She is not had any further vomiting since being here.  Will discuss with the hospital team for admission given about emesis as well as the elevated troponin and tachycardia.     ____________________________________________   FINAL CLINICAL IMPRESSION(S) / ED DIAGNOSES   Final diagnoses:  Hematemesis, presence of nausea not specified  Elevated troponin      MEDICATIONS GIVEN DURING THIS VISIT:  Medications  ondansetron (ZOFRAN) injection 4 mg (4 mg Intravenous Given 11/19/18 1722)  metoCLOPramide (REGLAN) injection 10 mg (10 mg Intravenous Given 11/19/18 1722)  pantoprazole (PROTONIX) 80 mg in sodium chloride 0.9 % 100 mL IVPB (0 mg Intravenous Stopped 11/19/18 1916)  sodium chloride 0.9 % bolus 1,000 mL (0 mLs Intravenous Stopped 11/19/18 2021)     ED Discharge Orders    None       Note:  This document was prepared using Dragon voice recognition software and may include unintentional dictation errors.   Vanessa , MD 11/19/18 2041

## 2018-11-19 NOTE — ED Notes (Signed)
Pt requesting water, MD Jari Pigg requesting waiting until admitting MD clears po intake

## 2018-11-19 NOTE — H&P (Signed)
[facility] NAME: Kayla Blanchard    MR#:  [id number]  DATE OF BIRTH:  1980/09/06  DATE OF ADMISSION:  11/19/2018  PRIMARY CARE PHYSICIAN: Lorelee Market, MD   REQUESTING/REFERRING PHYSICIAN: Jari Pigg, MD  CHIEF COMPLAINT:   Chief Complaint  Patient presents with  . Hematemesis    HISTORY OF PRESENT ILLNESS:  Kayla Blanchard  is a 38 y.o. female who presents with chief complaint as above.  Patient presents to the ED with a complaint of 2 to 3 days of intermittent small-volume hematemesis with abdominal pain.  Hemoglobin is stable here.  She also has somewhat elevated troponin.  She denies any chest pain.  She does have a history of prior heart attack.  Patient states to this writer that she does not want to stay in the hospital, and after some conversation with her husband in the room she decided that she would stay for evaluation despite not wanting to.  PAST MEDICAL HISTORY:   Past Medical History:  Diagnosis Date  . COPD (chronic obstructive pulmonary disease) ([place])   . Gastric ulcer   . History of kidney infection   . MI (myocardial infarction) ([place])      PAST SURGICAL HISTORY:   Past Surgical History:  Procedure Laterality Date  . NO PAST SURGERIES       SOCIAL HISTORY:   Social History   Tobacco Use  . Smoking status: Current Every Day Smoker    Packs/day: 1.00    Types: Cigarettes  . Smokeless tobacco: Never Used  Substance Use Topics  . Alcohol use: Yes    Comment: occasional - stopped drinking 11/05/2018     FAMILY HISTORY:    Family history reviewed and is non-contributory DRUG ALLERGIES:   Allergies  Allergen Reactions  . Amoxicillin Hives  . Banana   . Penicillins Hives    Has patient had a PCN reaction causing immediate rash, facial/tongue/throat swelling, SOB or lightheadedness with hypotension: Yes Has patient had a PCN reaction causing severe rash involving mucus membranes or  skin necrosis: No Has patient had a PCN reaction that required hospitalization No Has patient had a PCN reaction occurring within the last 10 years: No If all of the above answers are "NO", then may proceed with Cephalosporin use.     MEDICATIONS AT HOME:   Prior to Admission medications   Medication Sig Start Date End Date Taking? Authorizing Provider  desvenlafaxine (PRISTIQ) 100 MG 24 hr tablet Take 100 mg by mouth daily. 04/08/18   [provider]  medroxyPROGESTERone (DEPO-PROVERA) 150 MG/ML injection Inject 150 mg into the muscle every 3 (three) months.    [provider]  pantoprazole (PROTONIX) 20 MG tablet Take 1 tablet (20 mg total) by mouth daily. 05/26/18 05/26/19  Lavonia Drafts, MD  predniSONE (DELTASONE) 10 MG tablet Take 6 tablets once a day for 5 days, then decrease by 1 tablet each day for the next 5 days. Patient not taking: Reported on 05/26/2018 02/27/18   Johnn Hai, PA-C  PROAIR RESPICLICK [id number] [phone] Base) MCG/ACT AEPB Inhale 2 puffs into the lungs every 6 (six) hours as needed (shortness of breath / wheezing).  12/16/15   [provider]  sucralfate (CARAFATE) 1 g tablet Take 1 tablet (1 g total) by mouth 4 (four) times daily for 30 days. 05/26/18 06/25/18  Lavonia Drafts, MD  Tiotropium Bromide-Olodaterol (STIOLTO RESPIMAT) 2.5-2.5 MCG/ACT AERS Inhale 1 puff into the lungs daily.  [provider]    REVIEW OF SYSTEMS:  Review of Systems  Constitutional: Negative for chills, fever, malaise/fatigue and weight loss.  HENT: Negative for ear pain, hearing loss and tinnitus.   Eyes: Negative for blurred vision, double vision, pain and redness.  Respiratory: Negative for cough, hemoptysis and shortness of breath.   Cardiovascular: Negative for chest pain, palpitations, orthopnea and leg swelling.  Gastrointestinal: Negative for abdominal pain, constipation, diarrhea, nausea and vomiting.       Hematemesis  Genitourinary: Negative for  dysuria, frequency and hematuria.  Musculoskeletal: Negative for back pain, joint pain and neck pain.  Skin:       No acne, rash, or lesions  Neurological: Negative for dizziness, tremors, focal weakness and weakness.  Endo/Heme/Allergies: Negative for polydipsia. Does not bruise/bleed easily.  Psychiatric/Behavioral: Negative for depression. The patient is not nervous/anxious and does not have insomnia.      VITAL SIGNS:   Vitals:   11/19/18 1900 11/19/18 1930 11/19/18 2000 11/19/18 2030  BP: (!) 134/106 (!) 119/97 (!) 121/101 (!) 119/101  Pulse: (!) 118 (!) 123 (!) 119 (!) 124  Resp: (!) 23 13 15 17   Temp:      TempSrc:      SpO2: 97% 100% 100% 100%  Weight:       Wt Readings from Last 3 Encounters:  11/19/18 46.7 kg  05/26/18 46.7 kg  02/27/18 47.2 kg    PHYSICAL EXAMINATION:  Physical Exam  Vitals reviewed. Constitutional: She is oriented to person, place, and time. She appears well-developed and well-nourished.  HENT:  Head: Normocephalic and atraumatic.  Mouth/Throat: Oropharynx is clear and moist.  Eyes: Pupils are equal, round, and reactive to light. Conjunctivae and EOM are normal. No scleral icterus.  Neck: Normal range of motion. Neck supple. No JVD present. No thyromegaly present.  Cardiovascular: Normal rate, regular rhythm and intact distal pulses. Exam reveals no gallop and no friction rub.  No murmur heard. Respiratory: Effort normal and breath sounds normal. No respiratory distress. She has no wheezes. She has no rales.  GI: Soft. Bowel sounds are normal. She exhibits no distension. There is abdominal tenderness.  Musculoskeletal: Normal range of motion.        General: No edema.     Comments: No arthritis, no gout  Lymphadenopathy:    She has no cervical adenopathy.  Neurological: She is alert and oriented to person, place, and time. No cranial nerve deficit.  No dysarthria, no aphasia  Skin: Skin is warm and dry. No rash noted. No erythema.   Psychiatric: Judgment and thought content normal.  Agitated mood    LABORATORY PANEL:   CBC Recent Labs  Lab 11/19/18 1656  WBC 10.7*  HGB 17.8*  HCT 50.4*  PLT 364   ------------------------------------------------------------------------------------------------------------------  Chemistries  Recent Labs  Lab 11/19/18 1656  NA 133*  K 3.4*  CL 93*  CO2 25  GLUCOSE 135*  BUN 19  CREATININE 0.71  CALCIUM 9.3  AST 32  ALT 26  ALKPHOS 61  BILITOT 0.9   ------------------------------------------------------------------------------------------------------------------  Cardiac Enzymes No results for input(s): TROPONINI in the last 168 hours. ------------------------------------------------------------------------------------------------------------------  RADIOLOGY:  Dg Chest Portable 1 View  Result Date: 11/19/2018 CLINICAL DATA:  Intermittent vomiting for the past 4 days. Smoker. Quit drinking alcohol 2 weeks ago. EXAM: PORTABLE CHEST 1 VIEW COMPARISON:  12/23/2017 FINDINGS: Normal sized heart. Clear lungs. The lungs remain mildly hyperexpanded with mild peribronchial thickening. No free peritoneal air. Unremarkable bones. IMPRESSION: Stable mild  changes of COPD and chronic bronchitis. No acute abnormality. Electronically Signed   By: Claudie Revering M.D.   On: 11/19/2018 17:37    EKG:   Orders placed or performed during the hospital encounter of 11/19/18  . EKG 12-Lead  . EKG 12-Lead    IMPRESSION AND PLAN:  Principal Problem:   Hematemesis -hemoglobin stable, n.p.o. tonight, patient is on Protonix drip, GI consult Active Problems:   Elevated troponin -trend cardiac enzymes.  Suspicion this may be demand ischemia given her significant vomiting the past 2 days.  If her hemoglobin rises could consider cardiology consult with further work-up   Alcohol abuse -is potentially causative agent for her hematemesis, may be causing something like a pangastritis.  Work-up  for that as above.  CIWA protocol   COPD (chronic obstructive pulmonary disease) (HCC) -continue home meds   GERD (gastroesophageal reflux disease) -PPI as above   Chart review performed and case discussed with ED provider. Labs, imaging and/or ECG reviewed by provider and discussed with patient/family. Management plans discussed with the patient and/or family.  COVID-19 status: Tested negative     DVT PROPHYLAXIS: Mechanical only   GI PROPHYLAXIS:  PPI   ADMISSION STATUS: Inpatient     CODE STATUS: Full  TOTAL TIME TAKING CARE OF THIS PATIENT: 45 minutes.   This patient was evaluated in the context of the global COVID-19 pandemic, which necessitated consideration that the patient might be at risk for infection with the SARS-CoV-2 virus that causes COVID-19. Institutional protocols and algorithms that pertain to the evaluation of patients at risk for COVID-19 are in a state of rapid change based on information released by regulatory bodies including the CDC and federal and state organizations. These policies and algorithms were followed to the best of this provider's knowledge to date during the patient's care at this facility.  Ethlyn Daniels 11/19/2018, 9:47 PM  [facility]  Office  [phone]  CC: Primary care physician; Lorelee Market, MD  Note:  This document was prepared using Dragon voice recognition software and may include unintentional dictation errors.

## 2018-11-20 ENCOUNTER — Inpatient Hospital Stay
Admit: 2018-11-20 | Discharge: 2018-11-20 | Disposition: A | Discharge status: Home / Self Care | Payer: Medicaid Other | Attending: Internal Medicine | Admitting: Internal Medicine

## 2018-11-20 ENCOUNTER — Other Ambulatory Visit: Payer: Self-pay

## 2018-11-20 DIAGNOSIS — K920 Hematemesis: Secondary | ICD-10-CM

## 2018-11-20 LAB — BASIC METABOLIC PANEL
Anion gap: 8 (ref 5–15)
BUN: 13 mg/dL (ref 6–20)
CO2: 23 mmol/L (ref 22–32)
Calcium: 7.5 mg/dL — ABNORMAL LOW (ref 8.9–10.3)
Chloride: 106 mmol/L (ref 98–111)
Creatinine, Ser: 0.6 mg/dL (ref 0.44–1.00)
GFR calc Af Amer: >60 mL/min (ref >60)
GFR calc non Af Amer: >60 mL/min (ref >60)
Glucose, Bld: 97 mg/dL (ref 70–99)
Potassium: 2.9 mmol/L — ABNORMAL LOW (ref 3.5–5.1)
Sodium: 137 mmol/L (ref 135–145)

## 2018-11-20 LAB — URINALYSIS, COMPLETE (UACMP) WITH MICROSCOPIC
Bilirubin Urine: NEGATIVE
Glucose, UA: NEGATIVE mg/dL
Ketones, ur: 5 mg/dL — AB
Nitrite: NEGATIVE
Protein, ur: NEGATIVE mg/dL
RBC / HPF: >50 RBC/hpf — ABNORMAL HIGH (ref 0–5)
Specific Gravity, Urine: 1.015 (ref 1.005–1.030)
pH: 6 (ref 5.0–8.0)

## 2018-11-20 LAB — CBC
HCT: 38.9 % (ref 36.0–46.0)
Hemoglobin: 13.5 g/dL (ref 12.0–15.0)
MCH: 34.5 pg — ABNORMAL HIGH (ref 26.0–34.0)
MCHC: 34.7 g/dL (ref 30.0–36.0)
MCV: 99.5 fL (ref 80.0–100.0)
Platelets: 272 10*3/uL (ref 150–400)
RBC: 3.91 MIL/uL (ref 3.87–5.11)
RDW: 11.7 % (ref 11.5–15.5)
WBC: 7.6 10*3/uL (ref 4.0–10.5)
nRBC: 0 % (ref 0.0–0.2)

## 2018-11-20 LAB — URINE DRUG SCREEN, QUALITATIVE (ARMC ONLY)
Amphetamines, Ur Screen: NOT DETECTED
Barbiturates, Ur Screen: NOT DETECTED
Benzodiazepine, Ur Scrn: NOT DETECTED
Cannabinoid 50 Ng, Ur ~~LOC~~: POSITIVE — AB
Cocaine Metabolite,Ur ~~LOC~~: NOT DETECTED
MDMA (Ecstasy)Ur Screen: NOT DETECTED
Methadone Scn, Ur: NOT DETECTED
Opiate, Ur Screen: NOT DETECTED
Phencyclidine (PCP) Ur S: NOT DETECTED
Tricyclic, Ur Screen: NOT DETECTED

## 2018-11-20 LAB — ECHOCARDIOGRAM COMPLETE
Height: 62 in
Weight: 1617.6 oz

## 2018-11-20 MED ORDER — POTASSIUM CHLORIDE CRYS ER 20 MEQ PO TBCR
40.0000 meq | EXTENDED_RELEASE_TABLET | ORAL | Status: AC
  Administered 2018-11-20 (×2): 40 meq via ORAL
  Filled 2018-11-20 (×2): qty 2

## 2018-11-20 MED ORDER — ARFORMOTEROL TARTRATE 15 MCG/2ML IN NEBU
15.0000 ug | INHALATION_SOLUTION | Freq: Two times a day (BID) | RESPIRATORY_TRACT | Status: DC
  Administered 2018-11-20 – 2018-11-21 (×3): 15 ug via RESPIRATORY_TRACT
  Filled 2018-11-20 (×5): qty 2

## 2018-11-20 MED ORDER — LORAZEPAM 2 MG/ML IJ SOLN: 1.0000 mg | Freq: Four times a day (QID) | INTRAMUSCULAR | Status: DC | PRN

## 2018-11-20 MED ORDER — ADULT MULTIVITAMIN W/MINERALS CH: 1.0000 | ORAL_TABLET | Freq: Every day | ORAL | Status: DC

## 2018-11-20 MED ORDER — METOPROLOL TARTRATE 5 MG/5ML IV SOLN
2.5000 mg | Freq: Four times a day (QID) | INTRAVENOUS | Status: DC | PRN
  Administered 2018-11-20: 2.5 mg via INTRAVENOUS
  Filled 2018-11-20: qty 5

## 2018-11-20 MED ORDER — INFLUENZA VAC SPLIT QUAD 0.5 ML IM SUSY
0.5000 mL | PREFILLED_SYRINGE | INTRAMUSCULAR | Status: DC
  Filled 2018-11-20: qty 0.5

## 2018-11-20 MED ORDER — ACETAMINOPHEN 325 MG PO TABS
650.0000 mg | ORAL_TABLET | Freq: Four times a day (QID) | ORAL | Status: DC | PRN
  Administered 2018-11-20: 650 mg via ORAL
  Filled 2018-11-20: qty 2

## 2018-11-20 MED ORDER — FOLIC ACID 1 MG PO TABS: 1.0000 mg | ORAL_TABLET | Freq: Every day | ORAL | Status: DC

## 2018-11-20 MED ORDER — THIAMINE HCL 100 MG/ML IJ SOLN
100.0000 mg | Freq: Every day | INTRAMUSCULAR | Status: DC
  Administered 2018-11-20: 100 mg via INTRAVENOUS
  Filled 2018-11-20: qty 2

## 2018-11-20 MED ORDER — VITAMIN B-1 100 MG PO TABS: 100.0000 mg | ORAL_TABLET | Freq: Every day | ORAL | Status: DC

## 2018-11-20 MED ORDER — PNEUMOCOCCAL VAC POLYVALENT 25 MCG/0.5ML IJ INJ: 0.5000 mL | INJECTION | INTRAMUSCULAR | Status: DC

## 2018-11-20 MED ORDER — ACETAMINOPHEN 650 MG RE SUPP: 650.0000 mg | Freq: Four times a day (QID) | RECTAL | Status: DC | PRN

## 2018-11-20 MED ORDER — LORAZEPAM 1 MG PO TABS: 1.0000 mg | ORAL_TABLET | Freq: Four times a day (QID) | ORAL | Status: DC | PRN

## 2018-11-20 MED ORDER — SODIUM CHLORIDE 0.9 % IV SOLN
INTRAVENOUS | Status: AC
  Administered 2018-11-20: 01:00:00 via INTRAVENOUS

## 2018-11-20 MED ORDER — POTASSIUM CHLORIDE IN NACL 40-0.9 MEQ/L-% IV SOLN
INTRAVENOUS | Status: DC
  Administered 2018-11-20: 100 mL/h via INTRAVENOUS
  Filled 2018-11-20 (×3): qty 1000

## 2018-11-20 MED ORDER — ONDANSETRON HCL 4 MG PO TABS: 4.0000 mg | ORAL_TABLET | Freq: Four times a day (QID) | ORAL | Status: DC | PRN

## 2018-11-20 MED ORDER — ONDANSETRON HCL 4 MG/2ML IJ SOLN: 4.0000 mg | Freq: Four times a day (QID) | INTRAMUSCULAR | Status: DC | PRN

## 2018-11-20 MED ORDER — UMECLIDINIUM BROMIDE 62.5 MCG/INH IN AEPB
1.0000 | INHALATION_SPRAY | Freq: Every day | RESPIRATORY_TRACT | Status: DC
  Administered 2018-11-20: 1 via RESPIRATORY_TRACT
  Filled 2018-11-20: qty 7

## 2018-11-20 NOTE — Consult Note (Signed)
[facility] Consultation Note  Patient ID: Kayla Blanchard, MRN: [id number], DOB/AGE: [birth year] 40 y.o. Admit date: 11/19/2018   Date of Consult: 11/20/2018 Primary Physician: Lorelee Market, MD Primary Cardiologist: None  Chief Complaint:  Chief Complaint  Patient presents with  . Hematemesis   Reason for Consult: History of cardiovascular disease  HPI: 38 y.o. female with a history of cardiovascular disease which appears to be possibility of a non-ST elevation myocardial infarction either to stress or coronary dissection by history.  The patient was medically managed in the past with no need for intervention whatsoever.  The patient had this many many years ago and has not had any cardiovascular risks requiring further treatment.  She does smoke and does have borderline hypertension and has recently stopped alcohol use.  Recently she has had hematemesis with concerns of ulcer and other issues for which has recurred multiple times in the last several weeks.  This has settled down and improved at this time and no further episode.  EKG has known sinus tachycardia otherwise normal EKG with a troponin level is 135 and no evidence of acute coronary syndrome or anginal symptoms at this time.  The patient has no evidence of heart failure.  Currently she is at lowest risk possible for cardiovascular complication with further investigation including endoscopy  Past Medical History:  Diagnosis Date  . COPD (chronic obstructive pulmonary disease) ([place])   . Gastric ulcer   . History of kidney infection   . MI (myocardial infarction) [facility])       Surgical History:  Past Surgical History:  Procedure Laterality Date  . NO PAST SURGERIES       Home Meds: Prior to Admission medications   Medication Sig Start Date End Date Taking? Authorizing Provider  desvenlafaxine (PRISTIQ) 100 MG 24 hr tablet Take 100 mg by mouth daily. 04/08/18  Yes [provider]  pantoprazole  (PROTONIX) 20 MG tablet Take 1 tablet (20 mg total) by mouth daily. Patient not taking: Reported on 11/19/2018 05/26/18 05/26/19  Lavonia Drafts, MD  predniSONE (DELTASONE) 10 MG tablet Take 6 tablets once a day for 5 days, then decrease by 1 tablet each day for the next 5 days. Patient not taking: Reported on 05/26/2018 02/27/18   Johnn Hai, PA-C    Inpatient Medications:  . arformoterol  15 mcg Nebulization BID  . folic acid  1 mg Oral Daily  . multivitamin with minerals  1 tablet Oral Daily  . nicotine  14 mg Transdermal Daily  . [START ON 11/21/2018] pneumococcal 23 valent vaccine  0.5 mL Intramuscular Tomorrow-1000  . potassium chloride  40 mEq Oral Q4H  . thiamine  100 mg Oral Daily   Or  . thiamine  100 mg Intravenous Daily  . traZODone  50 mg Oral QHS  . umeclidinium bromide  1 puff Inhalation Daily   . 0.9 % NaCl with KCl 40 mEq / L 100 mL/hr (11/20/18 1310)    Allergies:  Allergies  Allergen Reactions  . Amoxicillin Hives  . Banana Other (See Comments)    Reaction: unknown  . Penicillins Hives    Has patient had a PCN reaction causing immediate rash, facial/tongue/throat swelling, SOB or lightheadedness with hypotension: Yes Has patient had a PCN reaction causing severe rash involving mucus membranes or skin necrosis: No Has patient had a PCN reaction that required hospitalization No Has patient had a PCN reaction occurring within the last 10 years: No If all of the above  answers are "NO", then may proceed with Cephalosporin use.     Social History   Socioeconomic History  . Marital status: Single    Spouse name: Not on file  . Number of children: Not on file  . Years of education: Not on file  . Highest education level: Not on file  Occupational History  . Not on file  Social Needs  . Financial resource strain: Not on file  . Food insecurity    Worry: Not on file    Inability: Not on file  . Transportation needs    Medical: Not on file    Non-medical:  Not on file  Tobacco Use  . Smoking status: Current Every Day Smoker    Packs/day: 1.00    Types: Cigarettes  . Smokeless tobacco: Never Used  Substance and Sexual Activity  . Alcohol use: Yes    Comment: occasional - stopped drinking 11/05/2018  . Drug use: No  . Sexual activity: Not on file  Lifestyle  . Physical activity    Days per week: Not on file    Minutes per session: Not on file  . Stress: Not on file  Relationships  . Social Herbalist on phone: Not on file    Gets together: Not on file    Attends religious service: Not on file    Active member of club or organization: Not on file    Attends meetings of clubs or organizations: Not on file    Relationship status: Not on file  . Intimate partner violence    Fear of current or ex partner: Not on file    Emotionally abused: Not on file    Physically abused: Not on file    Forced sexual activity: Not on file  Other Topics Concern  . Not on file  Social History Narrative  . Not on file     No family history on file.   Review of Systems Positive for hematemesis Negative for: General:  chills, fever, night sweats or weight changes.  Cardiovascular: PND orthopnea syncope dizziness  Dermatological skin lesions rashes Respiratory: Cough congestion Urologic: Frequent urination urination at night and hematuria Abdominal: negative for nausea, vomiting, diarrhea, bright red blood per rectum, melena, positive for hematemesis Neurologic: negative for visual changes, and/or hearing changes  All other systems reviewed and are otherwise negative except as noted above.  Labs: No results for input(s): CKTOTAL, CKMB, TROPONINI in the last 72 hours. Lab Results  Component Value Date   WBC 7.6 11/20/2018   HGB 13.5 11/20/2018   HCT 38.9 11/20/2018   MCV 99.5 11/20/2018   PLT 272 11/20/2018    Recent Labs  Lab 11/19/18 1656 11/20/18 0555  NA 133* 137  K 3.4* 2.9*  CL 93* 106  CO2 25 23  BUN 19 13   CREATININE 0.71 0.60  CALCIUM 9.3 7.5*  PROT 7.9  --   BILITOT 0.9  --   ALKPHOS 61  --   ALT 26  --   AST 32  --   GLUCOSE 135* 97   No results found for: CHOL, HDL, LDLCALC, TRIG No results found for: DDIMER  Radiology/Studies:  Dg Chest Portable 1 View  Result Date: 11/19/2018 CLINICAL DATA:  Intermittent vomiting for the past 4 days. Smoker. Quit drinking alcohol 2 weeks ago. EXAM: PORTABLE CHEST 1 VIEW COMPARISON:  12/23/2017 FINDINGS: Normal sized heart. Clear lungs. The lungs remain mildly hyperexpanded with mild peribronchial thickening. No free peritoneal air.  Unremarkable bones. IMPRESSION: Stable mild changes of COPD and chronic bronchitis. No acute abnormality. Electronically Signed   By: Claudie Revering M.D.   On: 11/19/2018 17:37    EKG: Sinus tachycardia otherwise normal EKG  Weights: Filed Weights   11/19/18 1645 11/20/18 0013  Weight: 46.7 kg 45.9 kg     Physical Exam: Blood pressure (!) 93/53, pulse (!) 103, temperature 97.6 F (36.4 C), temperature source Oral, resp. rate 18, height 5\' 2"  (1.575 m), weight 45.9 kg, last menstrual period 11/18/2018, SpO2 99 %. Body mass index is 18.49 kg/m. General: Well developed, well nourished, in no acute distress. Head eyes ears nose throat: Normocephalic, atraumatic, sclera non-icteric, no xanthomas, nares are without discharge. No apparent thyromegaly and/or mass  Lungs: Normal respiratory effort.  no wheezes, no rales, no rhonchi.  Heart: RRR with normal S1 S2. no murmur gallop, no rub, PMI is normal size and placement, carotid upstroke normal without bruit, jugular venous pressure is normal Abdomen: Soft, non-tender, non-distended with normoactive bowel sounds. No hepatomegaly. No rebound/guarding. No obvious abdominal masses. Abdominal aorta is normal size without bruit Extremities: No edema. no cyanosis, no clubbing, no ulcers  Peripheral : 2+ bilateral upper extremity pulses, 2+ bilateral femoral pulses, 2+ bilateral  dorsal pedal pulse Neuro: Alert and oriented. No facial asymmetry. No focal deficit. Moves all extremities spontaneously. Musculoskeletal: Normal muscle tone without kyphosis Psych:  Responds to questions appropriately with a normal affect.    Assessment: 38 year old female with history of cardiovascular issues although no current evidence of cardiovascular issues or EKG changes with no significant risk factors other than tobacco abuse recently stopping alcohol use with hematemesis currently stable without evidence of myocardial infarction or heart failure at lowest risk possible for cardiovascular complication with endoscopy and other treatments  Plan: 1.  Proceed to endoscopy and other treatment for hematemesis 2.  Patient was counseled on the discontinuation of tobacco abuse 3.  Discontinuation of alcohol use 4.  Further treatment if patient has other cardiovascular symptoms in the future  Signed, Corey Skains M.D. [facility] 11/20/2018, 1:25 PM

## 2018-11-20 NOTE — Consult Note (Signed)
PHARMACY CONSULT NOTE - FOLLOW UP  Pharmacy Consult for Electrolyte Monitoring and Replacement   Recent Labs: Potassium (mmol/L)  Date Value  11/20/2018 2.9 (L)  10/24/2013 3.6   Calcium (mg/dL)  Date Value  11/20/2018 7.5 (L)   Calcium, Total (mg/dL)  Date Value  10/24/2013 7.6 (L)   Albumin (g/dL)  Date Value  11/19/2018 4.8  10/23/2013 3.0 (L)   Sodium (mmol/L)  Date Value  11/20/2018 137  10/24/2013 136     Assessment: Pharmacy consulted to monitor and replace electrolytes in 38yo patient.  Goal of Therapy:  Electrolytes WNL  Plan:  K 2.9. Will order KCl 68mEq x 2 doses.  Will continue to follow levels with AM labs.  Pearla Dubonnet ,PharmD Clinical Pharmacist 11/20/2018 3:25 PM

## 2018-11-20 NOTE — Progress Notes (Signed)
*  PRELIMINARY RESULTS* Echocardiogram 2D Echocardiogram has been performed.  Kayla Blanchard 11/20/2018, 9:50 AM

## 2018-11-20 NOTE — Progress Notes (Signed)
Initial Nutrition Assessment  RD working remotely.  DOCUMENTATION CODES:   Underweight  INTERVENTION:  Once diet advanced provide Ensure Enlive po TID, each supplement provides 350 kcal and 20 grams of protein.  Provide daily MVI with diet advancement.  Reviewed "Peptic Ulcer Nutrition Therapy" handout from [employer] with patient over the phone per her request. Will mail handout to patient.   Will also send patient recipe for homemade oral nutrition supplement.  NUTRITION DIAGNOSIS:   Inadequate oral intake related to decreased appetite as evidenced by per patient/family report.  GOAL:   Patient will meet greater than or equal to 90% of their needs  MONITOR:   PO intake, Diet advancement, Supplement acceptance, Weight trends, Labs, Skin, I & O's  REASON FOR ASSESSMENT:   Malnutrition Screening Tool, Consult Assessment of nutrition requirement/status  ASSESSMENT:   38 year old female with PMHx of COPD, hx MI, gastric ulcer admitted with hematemesis, GI bleed.   Spoke with patient over the phone. She reports she has had a poor appetite and has been unable to eat since 9/3 due to hematemesis. She reports she is feeling a little better today and has been tolerating clear liquids. She reports at baseline she also does not have a very big appetite. She struggles with tolerating food in setting of her gastric ulcer. She requested a handout regarding gastric ulcer.  Patient reports her UBW was 120 lbs and she has been losing weight. Did not see a weight that high in the chart. Per chart she was 49.4 kg on 11/18/2017. She is now 45.9 kg. That is insignificant weight loss over a year.  Medications reviewed and include: folic acid 1 mg daily, MVI 1 tablet daily, nicotine patch, thiamine 100 mg daily, NS with KCl 40 mEq/L at 100 mL/hr.  Labs reviewed: Potassium 2.9.  Patient is at risk for malnutrition. Unable to determine if she meets criteria for malnutrition  at this time without completing NFPE.  NUTRITION - FOCUSED PHYSICAL EXAM:  Unable to complete at this time.  Diet Order:   Diet Order            Diet NPO time specified Except for: Ice Chips, Sips with Meds  Diet effective midnight        Diet NPO time specified  Diet effective midnight        Diet clear liquid Room service appropriate? Yes; Fluid consistency: Thin  Diet effective now             EDUCATION NEEDS:   Education needs have been addressed  Skin:  Skin Assessment: Reviewed RN Assessment  Last BM:  Unknown  Height:   Ht Readings from Last 1 Encounters:  11/20/18 5\' 2"  (1.575 m)   Weight:   Wt Readings from Last 1 Encounters:  11/20/18 45.9 kg   Ideal Body Weight:  50 kg  BMI:  Body mass index is 18.49 kg/m.  Estimated Nutritional Needs:   Kcal:  1500-1700  Protein:  75-85 grams  Fluid:  1.5 L/day  Willey Blade, MS, RD, LDN Office: [phone] Pager: [phone] After Hours/Weekend Pager: [phone]

## 2018-11-20 NOTE — Consult Note (Signed)
Kayla Lame, MD [facility]  [address], [place], [place] [address] Phone: [phone] Fax : [phone]  Consultation  Referring Provider:     Dr. Posey Pronto Primary Care Physician:  Lorelee Market, MD Primary Gastroenterologist: Althia Forts         Reason for Consultation:     Hematemesis  Date of Admission:  11/19/2018 Date of Consultation:  11/20/2018         HPI:   Kayla Blanchard is a 38 y.o. female with an extensive history of alcohol abuse.  The patient states she drinks at least a 12 pack of beer a day but quit 15 days ago.  The patient started to have hematemesis since last Thursday which was 5 days ago.  She reports that there was blood in the vomitus the first time she vomited.  The patient's hemoglobin on admission was 17.8 that was down to normal at 13.4 this morning.  The patient had a troponin sent in the ER that was elevated at 129 that then increased to 135.  The patient has a history of an MI history of gastric ulcers and COPD. The patient reports that she has epigastric pain but denies any black stools or bloody stools.  She also denies that she is taking any anticoagulation medication.  The patient did have a upper endoscopy scheduled with Dr. Alice Reichert back in December 2019 but the patient canceled.  She states that it was not covered by her insurance.  Past Medical History:  Diagnosis Date  . COPD (chronic obstructive pulmonary disease) ([address])   . Gastric ulcer   . History of kidney infection   . MI (myocardial infarction) [facility])     Past Surgical History:  Procedure Laterality Date  . NO PAST SURGERIES      Prior to Admission medications   Medication Sig Start Date End Date Taking? Authorizing Provider  desvenlafaxine (PRISTIQ) 100 MG 24 hr tablet Take 100 mg by mouth daily. 04/08/18  Yes [provider]  pantoprazole (PROTONIX) 20 MG tablet Take 1 tablet (20 mg total) by mouth daily. Patient not taking: Reported on 11/19/2018 05/26/18 05/26/19   Lavonia Drafts, MD  predniSONE (DELTASONE) 10 MG tablet Take 6 tablets once a day for 5 days, then decrease by 1 tablet each day for the next 5 days. Patient not taking: Reported on 05/26/2018 02/27/18   Johnn Hai, PA-C    No family history on file.   Social History   Tobacco Use  . Smoking status: Current Every Day Smoker    Packs/day: 1.00    Types: Cigarettes  . Smokeless tobacco: Never Used  Substance Use Topics  . Alcohol use: Yes    Comment: occasional - stopped drinking 11/05/2018  . Drug use: No    Allergies as of 11/19/2018 - Review Complete 11/19/2018  Allergen Reaction Noted  . Amoxicillin Hives 08/06/2015  . Banana Other (See Comments) 12/23/2017  . Penicillins Hives 08/06/2015    Review of Systems:    All systems reviewed and negative except where noted in HPI.   Physical Exam:  Vital signs in last 24 hours: Temp:  [97.6 F (36.4 C)-98.5 F (36.9 C)] 97.6 F (36.4 C) (09/08 0841) Pulse Rate:  [103-128] 103 (09/08 0841) Resp:  [12-24] 18 (09/08 0556) BP: (84-139)/(53-110) 93/53 (09/08 0841) SpO2:  [96 %-100 %] 99 % (09/08 0841) Weight:  [45.9 kg-46.7 kg] 45.9 kg (09/08 0013)   General:   Pleasant, cooperative in NAD Head:  Normocephalic  and atraumatic. Eyes:   No icterus.   Conjunctiva pink. PERRLA. Ears:  Normal auditory acuity. Neck:  Supple; no masses or thyroidomegaly Lungs: Respirations even and unlabored. Lungs clear to auscultation bilaterally.   No wheezes, crackles, or rhonchi.  Heart:  Regular rate and rhythm;  Without murmur, clicks, rubs or gallops Abdomen:  Soft, nondistended, epigastric tenderness. Normal bowel sounds. No appreciable masses or hepatomegaly.  No rebound or guarding.  Rectal:  Not performed. Msk:  Symmetrical without gross deformities.    Extremities:  Without edema, cyanosis or clubbing. Neurologic:  Alert and oriented x3;  grossly normal neurologically. Skin:  Intact without significant lesions or rashes. Cervical  Nodes:  No significant cervical adenopathy. Psych:  Alert and cooperative. Normal affect.  LAB RESULTS: Recent Labs    11/19/18 1656 11/20/18 0555  WBC 10.7* 7.6  HGB 17.8* 13.5  HCT 50.4* 38.9  PLT 364 272   BMET Recent Labs    11/19/18 1656 11/20/18 0555  NA 133* 137  K 3.4* 2.9*  CL 93* 106  CO2 25 23  GLUCOSE 135* 97  BUN 19 13  CREATININE 0.71 0.60  CALCIUM 9.3 7.5*   LFT Recent Labs    11/19/18 1656  PROT 7.9  ALBUMIN 4.8  AST 32  ALT 26  ALKPHOS 61  BILITOT 0.9   PT/INR Recent Labs    11/19/18 1721  LABPROT 12.6  INR 1.0    STUDIES: Dg Chest Portable 1 View  Result Date: 11/19/2018 CLINICAL DATA:  Intermittent vomiting for the past 4 days. Smoker. Quit drinking alcohol 2 weeks ago. EXAM: PORTABLE CHEST 1 VIEW COMPARISON:  12/23/2017 FINDINGS: Normal sized heart. Clear lungs. The lungs remain mildly hyperexpanded with mild peribronchial thickening. No free peritoneal air. Unremarkable bones. IMPRESSION: Stable mild changes of COPD and chronic bronchitis. No acute abnormality. Electronically Signed   By: Claudie Revering M.D.   On: 11/19/2018 17:37      Impression / Plan:   Assessment: Principal Problem:   Hematemesis Active Problems:   Alcohol abuse   Elevated troponin   COPD (chronic obstructive pulmonary disease) (HCC)   GERD (gastroesophageal reflux disease)   Kayla Blanchard is a 38 y.o. y/o female with with a history of gastritis in the past with a visit to the ER back in October 2019 and again in March of this year.  The patient has stopped drinking in the last 2 weeks.  The patient was kept n.p.o. although she cannot keep food down in preparation for an upper endoscopy for today.  Plan:  This patient has had hematemesis with a stable hemoglobin and hematocrit.  The patient also has a history of an MI with elevated troponins that have increased slightly from the initial test on admission.  The patient was presented to anesthesia who  stated that the patient would need cardiac clearance before any procedures were done on this patient due to her cardiac risk.  The patient has gone for an echocardiogram this morning and I will await the results of this and cardiac input for clearance prior to proceeding with any endoscopic procedures.  The patient has been explained the plan and agrees with it.  Thank you for involving me in the care of this patient.      LOS: 1 day   Kayla Lame, MD  11/20/2018, 10:55 AM    Note: This dictation was prepared with Dragon dictation along with smaller phrase technology. Any transcriptional errors that result from this process are  unintentional.

## 2018-11-20 NOTE — Progress Notes (Signed)
UA collected and sent.  Patient is having menses.  Urine has blood in it.

## 2018-11-20 NOTE — Progress Notes (Signed)
[place] at [facility]                                                                                                                                                                                  Patient Demographics   Kayla Blanchard, is a 38 y.o. female, DOB - 05/31/1980, [id number]  Admit date - 11/19/2018   Admitting Physician Lance Coon, MD  Outpatient Primary MD for the patient is Lorelee Market, MD   LOS - 1  Subjective:  Patient states that she stopped drinking recently.  Denies any further hematemesis.  Patient denies any chest pain.   Review of Systems:   CONSTITUTIONAL: No documented fever. No fatigue, weakness. No weight gain, no weight loss.  EYES: No blurry or double vision.  ENT: No tinnitus. No postnasal drip. No redness of the oropharynx.  RESPIRATORY: No cough, no wheeze, no hemoptysis. No dyspnea.  CARDIOVASCULAR: No chest pain. No orthopnea. No palpitations. No syncope.  GASTROINTESTINAL: No nausea, no vomiting or diarrhea. No abdominal pain. No melena or hematochezia.  GENITOURINARY: No dysuria or hematuria.  ENDOCRINE: No polyuria or nocturia. No heat or cold intolerance.  HEMATOLOGY: No anemia. No bruising. No bleeding.  INTEGUMENTARY: No rashes. No lesions.  MUSCULOSKELETAL: No arthritis. No swelling. No gout.  NEUROLOGIC: No numbness, tingling, or ataxia. No seizure-type activity.  PSYCHIATRIC: No anxiety. No insomnia. No ADD.    Vitals:   Vitals:   11/20/18 0556 11/20/18 0559 11/20/18 0825 11/20/18 0841  BP: (!) 84/62 92/68  (!) 93/53  Pulse: (!) 103 (!) 109  (!) 103  Resp: 18     Temp: 97.7 F (36.5 C)   97.6 F (36.4 C)  TempSrc: Oral   Oral  SpO2: 98% 99% 98% 99%  Weight:      Height:        Wt Readings from Last 3 Encounters:  11/20/18 45.9 kg  05/26/18 46.7 kg  02/27/18 47.2 kg     Intake/Output Summary (Last 24 hours) at 11/20/2018 1516 Last data filed at 11/20/2018 1408 Gross per 24 hour   Intake 966.77 ml  Output -  Net 966.77 ml    Physical Exam:   GENERAL: Pleasant-appearing in no apparent distress.  HEAD, EYES, EARS, NOSE AND THROAT: Atraumatic, normocephalic. Extraocular muscles are intact. Pupils equal and reactive to light. Sclerae anicteric. No conjunctival injection. No oro-pharyngeal erythema.  NECK: Supple. There is no jugular venous distention. No bruits, no lymphadenopathy, no thyromegaly.  HEART: Regular rate and rhythm,. No murmurs, no rubs, no clicks.  LUNGS: Clear to auscultation bilaterally. No rales or rhonchi. No wheezes.  ABDOMEN: Soft, flat, nontender, nondistended. Has good bowel  sounds. No hepatosplenomegaly appreciated.  EXTREMITIES: No evidence of any cyanosis, clubbing, or peripheral edema.  +2 pedal and radial pulses bilaterally.  NEUROLOGIC: The patient is alert, awake, and oriented x3 with no focal motor or sensory deficits appreciated bilaterally.  SKIN: Moist and warm with no rashes appreciated.  Psych: Not anxious, depressed LN: No inguinal LN enlargement    Antibiotics   Anti-infectives (From admission, onward)   None      Medications   Scheduled Meds: . arformoterol  15 mcg Nebulization BID  . folic acid  1 mg Oral Daily  . [START ON 11/21/2018] influenza vac split quadrivalent PF  0.5 mL Intramuscular Tomorrow-1000  . multivitamin with minerals  1 tablet Oral Daily  . nicotine  14 mg Transdermal Daily  . [START ON 11/21/2018] pneumococcal 23 valent vaccine  0.5 mL Intramuscular Tomorrow-1000  . potassium chloride  40 mEq Oral Q4H  . thiamine  100 mg Oral Daily   Or  . thiamine  100 mg Intravenous Daily  . traZODone  50 mg Oral QHS  . umeclidinium bromide  1 puff Inhalation Daily   Continuous Infusions: . 0.9 % NaCl with KCl 40 mEq / L 100 mL/hr (11/20/18 1310)   PRN Meds:.acetaminophen **OR** acetaminophen, LORazepam **OR** LORazepam, methocarbamol, metoprolol tartrate, ondansetron **OR** ondansetron (ZOFRAN) IV   Data  Review:   Micro Results Recent Results (from the past 240 hour(s))  SARS Coronavirus 2 [facility] order, Performed in [facility] hospital lab) Nasopharyngeal Nasopharyngeal Swab     Status: None   Collection Time: 11/19/18  7:21 PM   Specimen: Nasopharyngeal Swab  Result Value Ref Range Status   SARS Coronavirus 2 NEGATIVE NEGATIVE Final    Comment: (NOTE) If result is NEGATIVE SARS-CoV-2 target nucleic acids are NOT DETECTED. The SARS-CoV-2 RNA is generally detectable in upper and lower  respiratory specimens during the acute phase of infection. The lowest  concentration of SARS-CoV-2 viral copies this assay can detect is 250  copies / mL. A negative result does not preclude SARS-CoV-2 infection  and should not be used as the sole basis for treatment or other  patient management decisions.  A negative result may occur with  improper specimen collection / handling, submission of specimen other  than nasopharyngeal swab, presence of viral mutation(s) within the  areas targeted by this assay, and inadequate number of viral copies  (<250 copies / mL). A negative result must be combined with clinical  observations, patient history, and epidemiological information. If result is POSITIVE SARS-CoV-2 target nucleic acids are DETECTED. The SARS-CoV-2 RNA is generally detectable in upper and lower  respiratory specimens dur ing the acute phase of infection.  Positive  results are indicative of active infection with SARS-CoV-2.  Clinical  correlation with patient history and other diagnostic information is  necessary to determine patient infection status.  Positive results do  not rule out bacterial infection or co-infection with other viruses. If result is PRESUMPTIVE POSTIVE SARS-CoV-2 nucleic acids MAY BE PRESENT.   A presumptive positive result was obtained on the submitted specimen  and confirmed on repeat testing.  While 2019 novel coronavirus  (SARS-CoV-2) nucleic acids may be present  in the submitted sample  additional confirmatory testing may be necessary for epidemiological  and / or clinical management purposes  to differentiate between  SARS-CoV-2 and other Sarbecovirus currently known to infect humans.  If clinically indicated additional testing with an alternate test  methodology [phone]) is advised. The SARS-CoV-2 RNA is generally  detectable in upper and lower respiratory sp ecimens during the acute  phase of infection. The expected result is Negative. Fact Sheet for Patients:  StrictlyIdeas.no Fact Sheet for Healthcare Providers: BankingDealers.co.za This test is not yet approved or cleared by the [place] FDA and has been authorized for detection and/or diagnosis of SARS-CoV-2 by FDA under an Emergency Use Authorization (EUA).  This EUA will remain in effect (meaning this test can be used) for the duration of the COVID-19 declaration under Section 564(b)(1) of the Act, 21 U.S.C. section 360bbb-3(b)(1), unless the authorization is terminated or revoked sooner. Performed at [facility], [address], [place], [place] [address]     Radiology Reports Dg Chest Portable 1 View  Result Date: 11/19/2018 CLINICAL DATA:  Intermittent vomiting for the past 4 days. Smoker. Quit drinking alcohol 2 weeks ago. EXAM: PORTABLE CHEST 1 VIEW COMPARISON:  12/23/2017 FINDINGS: Normal sized heart. Clear lungs. The lungs remain mildly hyperexpanded with mild peribronchial thickening. No free peritoneal air. Unremarkable bones. IMPRESSION: Stable mild changes of COPD and chronic bronchitis. No acute abnormality. Electronically Signed   By: Claudie Revering M.D.   On: 11/19/2018 17:37     CBC Recent Labs  Lab 11/19/18 1656 11/20/18 0555  WBC 10.7* 7.6  HGB 17.8* 13.5  HCT 50.4* 38.9  PLT 364 272  MCV 97.3 99.5  MCH 34.4* 34.5*  MCHC 35.3 34.7  RDW 11.6 11.7    Chemistries  Recent Labs  Lab 11/19/18 1656  11/20/18 0555  NA 133* 137  K 3.4* 2.9*  CL 93* 106  CO2 25 23  GLUCOSE 135* 97  BUN 19 13  CREATININE 0.71 0.60  CALCIUM 9.3 7.5*  AST 32  --   ALT 26  --   ALKPHOS 61  --   BILITOT 0.9  --    ------------------------------------------------------------------------------------------------------------------ estimated creatinine clearance is 67.7 mL/min (by C-G formula based on SCr of 0.6 mg/dL). ------------------------------------------------------------------------------------------------------------------ No results for input(s): HGBA1C in the last 72 hours. ------------------------------------------------------------------------------------------------------------------ No results for input(s): CHOL, HDL, LDLCALC, TRIG, CHOLHDL, LDLDIRECT in the last 72 hours. ------------------------------------------------------------------------------------------------------------------ No results for input(s): TSH, T4TOTAL, T3FREE, THYROIDAB in the last 72 hours.  Invalid input(s): FREET3 ------------------------------------------------------------------------------------------------------------------ No results for input(s): VITAMINB12, FOLATE, FERRITIN, TIBC, IRON, RETICCTPCT in the last 72 hours.  Coagulation profile Recent Labs  Lab 11/19/18 1721  INR 1.0    No results for input(s): DDIMER in the last 72 hours.  Cardiac Enzymes No results for input(s): CKMB, TROPONINI, MYOGLOBIN in the last 168 hours.  Invalid input(s): CK ------------------------------------------------------------------------------------------------------------------ Invalid input(s): [place]  Patient is 38 year old admitted with hematemesis   #1 for GI bleed will need a EGD continue PPIs GI aspirin cardiology clearance  #2   Elevated troponin -trend cardiac enzymes.    Due to demand ischemia  #3 hypokalemia being replaced   #4 COPD (chronic obstructive pulmonary disease) ([place])  -continue home meds   #5 GERD (gastroesophageal reflux disease) -PPI as above  #6 hypokalemia being replaced: Recheck later today     Code Status Orders  (From admission, onward)         Start     Ordered   11/20/18 0017  Full code  Continuous     11/20/18 0016        Code Status History    This patient has a current code status but no historical code status.   Advance Care Planning Activity  Consults  Gi, cards  DVT Prophylaxisscds   Lab Results  Component Value Date   PLT 272 11/20/2018     Time Spent in minutes  45min  Greater than 50% of time spent in care coordination and counseling patient regarding the condition and plan of care.   Dustin Flock M.D on 11/20/2018 at 3:16 PM  Between 7am to 6pm - Pager - [phone]  After 6pm go to [email or web address] - Proofreader  [employer]   Office  [phone]

## 2018-11-21 ENCOUNTER — Encounter
Admission: EM | Disposition: A | Discharge status: Home / Self Care | Payer: Self-pay | Source: Home / Self Care | Attending: Internal Medicine

## 2018-11-21 ENCOUNTER — Inpatient Hospital Stay: Payer: Medicaid Other | Admitting: Anesthesiology

## 2018-11-21 DIAGNOSIS — K2900 Acute gastritis without bleeding: Secondary | ICD-10-CM

## 2018-11-21 DIAGNOSIS — K922 Gastrointestinal hemorrhage, unspecified: Secondary | ICD-10-CM

## 2018-11-21 HISTORY — PX: ESOPHAGOGASTRODUODENOSCOPY (EGD) WITH PROPOFOL: SHX5813

## 2018-11-21 LAB — CBC
HCT: 36.1 % (ref 36.0–46.0)
Hemoglobin: 12.1 g/dL (ref 12.0–15.0)
MCH: 34.6 pg — ABNORMAL HIGH (ref 26.0–34.0)
MCHC: 33.5 g/dL (ref 30.0–36.0)
MCV: 103.1 fL — ABNORMAL HIGH (ref 80.0–100.0)
Platelets: 238 10*3/uL (ref 150–400)
RBC: 3.5 MIL/uL — ABNORMAL LOW (ref 3.87–5.11)
RDW: 11.9 % (ref 11.5–15.5)
WBC: 5.9 10*3/uL (ref 4.0–10.5)
nRBC: 0 % (ref 0.0–0.2)

## 2018-11-21 LAB — BASIC METABOLIC PANEL
Anion gap: 4 — ABNORMAL LOW (ref 5–15)
BUN: 10 mg/dL (ref 6–20)
CO2: 18 mmol/L — ABNORMAL LOW (ref 22–32)
Calcium: 7.8 mg/dL — ABNORMAL LOW (ref 8.9–10.3)
Chloride: 117 mmol/L — ABNORMAL HIGH (ref 98–111)
Creatinine, Ser: 0.6 mg/dL (ref 0.44–1.00)
GFR calc Af Amer: >60 mL/min (ref >60)
GFR calc non Af Amer: >60 mL/min (ref >60)
Glucose, Bld: 84 mg/dL (ref 70–99)
Potassium: 5.2 mmol/L — ABNORMAL HIGH (ref 3.5–5.1)
Sodium: 139 mmol/L (ref 135–145)

## 2018-11-21 LAB — HIV ANTIBODY (ROUTINE TESTING W REFLEX): HIV Screen 4th Generation wRfx: NONREACTIVE

## 2018-11-21 SURGERY — ESOPHAGOGASTRODUODENOSCOPY (EGD) WITH PROPOFOL
Anesthesia: General

## 2018-11-21 MED ORDER — PROPOFOL 500 MG/50ML IV EMUL
INTRAVENOUS | Status: DC | PRN
  Administered 2018-11-21: 150 ug/kg/min via INTRAVENOUS

## 2018-11-21 MED ORDER — PROPOFOL 10 MG/ML IV BOLUS
INTRAVENOUS | Status: DC | PRN
  Administered 2018-11-21: 70 mg via INTRAVENOUS

## 2018-11-21 MED ORDER — LIDOCAINE HCL (CARDIAC) PF 100 MG/5ML IV SOSY
PREFILLED_SYRINGE | INTRAVENOUS | Status: DC | PRN
  Administered 2018-11-21: 80 mg via INTRAVENOUS

## 2018-11-21 MED ORDER — PANTOPRAZOLE SODIUM 40 MG PO TBEC: 40.0000 mg | DELAYED_RELEASE_TABLET | Freq: Two times a day (BID) | ORAL | 2 refills | Status: DC

## 2018-11-21 MED ORDER — SODIUM CHLORIDE 0.9 % IV SOLN
INTRAVENOUS | Status: DC
  Administered 2018-11-21: 10:00:00 via INTRAVENOUS

## 2018-11-21 MED ORDER — GLYCOPYRROLATE 0.2 MG/ML IJ SOLN
INTRAMUSCULAR | Status: DC | PRN
  Administered 2018-11-21: 0.2 mg via INTRAVENOUS

## 2018-11-21 NOTE — Progress Notes (Signed)
Ch visited pt who presented to be under emotional distress. Pt c/o not being able to eat yet. Ch checked in w/ care team to see what the pt would be allowed to have before her procedure. Pt shared that she has been dealing with GI issues for over a year. She has not been able to hv a procedure that she was scheduled for last year due to financial/insurance reasons. Pt also stated that she had a heart attack and bell palsy also a year ago. Ch was concerned about the pt's health hx that would limit her ability to be a viable candidate to remain on a job w/o risking her health and/or others. Ch suggested that she consider applying for disability. Pt was open to the suggestion and expressed her guilt of having to depend on her significant other for so much due to her health issues. Ch provided space for the pt to process her grief as the pt shared that she grew up in group homes and how she is tired of dealing with the same issues regarding her health. Pt shared that she is concerned about her heart numbers and fears having another heart attack. Pt stated that she is only able to tolerate soft foods when she does eat (Ensure, mashed potatoes, apple sauce). Ch is hopeful that the pt is able to find out the root cause of her GI issues and maintain a healthy weight.  Ch shared that she would f/u with pt to determine if she has received the procedure and no longer NPO.     11/21/18 0900  Clinical Encounter Type  Visited With Patient;Health care provider  Visit Type Psychological support;Spiritual support;Social support  Spiritual Encounters  Spiritual Needs Emotional;Grief support  Stress Factors  Patient Stress Factors Family relationships;Exhausted;Health changes;Loss of control;Major life changes  Family Stress Factors None identified

## 2018-11-21 NOTE — Op Note (Signed)
[facility] Gastroenterology Patient Name: Kayla Blanchard Procedure Date: 11/21/2018 10:00 AM MRN: [id number] Account #: [id number] Date of Birth: 1980/11/05 Admit Type: Inpatient Age: 38 Room: [facility] ENDO ROOM 1 Gender: Female Note Status: Finalized Procedure:            Upper GI endoscopy Indications:          Hematemesis Providers:            [name] MD, MD Referring MD:         Lorelee Market (Referring MD) Medicines:            Propofol per Anesthesia Complications:        No immediate complications. Procedure:            Pre-Anesthesia Assessment:                       - Prior to the procedure, a History and Physical was                        performed, and patient medications and allergies were                        reviewed. The patient's tolerance of previous                        anesthesia was also reviewed. The risks and benefits of                        the procedure and the sedation options and risks were                        discussed with the patient. All questions were                        answered, and informed consent was obtained. Prior                        Anticoagulants: The patient has taken no previous                        anticoagulant or antiplatelet agents. ASA Grade                        Assessment: II - A patient with mild systemic disease.                        After reviewing the risks and benefits, the patient was                        deemed in satisfactory condition to undergo the                        procedure.                       After obtaining informed consent, the endoscope was                        passed under direct vision. Throughout the procedure,  the patient's blood pressure, pulse, and oxygen                        saturations were monitored continuously. The Endoscope                        was introduced through the mouth, and advanced to the   second part of duodenum. The upper GI endoscopy was                        accomplished without difficulty. The patient tolerated                        the procedure well. Findings:      The examined esophagus was normal.      Localized minimal inflammation characterized by erythema was found in       the gastric antrum. Biopsies were taken with a cold forceps for       histology.      The examined duodenum was normal. Impression:           - Normal esophagus.                       - Gastritis. Biopsied.                       - Normal examined duodenum. Recommendation:       - Return patient to hospital ward for ongoing care.                       - Resume regular diet.                       - Continue present medications.                       - Await pathology results. Procedure Code(s):    --- Professional ---                       [phone], Esophagogastroduodenoscopy, flexible, transoral;                        with biopsy, single or multiple Diagnosis Code(s):    --- Professional ---                       K92.0, Hematemesis                       K29.70, Gastritis, unspecified, without bleeding CPT copyright 2019 American Medical Association. All rights reserved. The codes documented in this report are preliminary and upon coder review may  be revised to meet current compliance requirements. [name] MD, MD 11/21/2018 10:23:18 AM This report has been signed electronically. Number of Addenda: 0 Note Initiated On: 11/21/2018 10:00 AM Estimated Blood Loss: Estimated blood loss: none.      [facility] Advocate

## 2018-11-21 NOTE — Discharge Summary (Signed)
[employer] - [place] at [facility], 38 y.o., DOB Dec 06, 1980, MRN [id number]. Admission date: 11/19/2018 Discharge Date 11/21/2018 Primary MD Lorelee Market, MD Admitting Physician Lance Coon, MD  Admission Diagnosis  Elevated troponin [R79.89] Hematemesis, presence of nausea not specified [K92.0]  Discharge Diagnosis   Principal Problem:   Hematemesis due to upper GI bleed with gastritis  alcohol abuse   Elevated troponin due to demand ischemia   COPD (chronic obstructive pulmonary disease) (HCC)   GERD (gastroesophageal reflux disease) Decreased ejection fraction needs follow-up with primary cardiologist       Hospital Course Kayla Blanchard  is a 37 y.o. female who presents with chief complaint as above.  Patient presents to the ED with a complaint of 2 to 3 days of intermittent small-volume hematemesis with abdominal pain.  Patient also was noted to have drop in hemoglobin.  Therefore she was admitted and was seen by GI she underwent EGD which showed gastritis she was placed on PPIs.  Patient also was noted to have elevated troponin echocardiogram showed a decreased ejection fraction.  Patient was recommended to follow-up with her cardiologist at [facility].  She is very anxious to go home.  Hemoglobin stable.            Consults  cardiologym gi  Significant Tests:  See full reports for all details    Dg Chest Portable 1 View  Result Date: 11/19/2018 CLINICAL DATA:  Intermittent vomiting for the past 4 days. Smoker. Quit drinking alcohol 2 weeks ago. EXAM: PORTABLE CHEST 1 VIEW COMPARISON:  12/23/2017 FINDINGS: Normal sized heart. Clear lungs. The lungs remain mildly hyperexpanded with mild peribronchial thickening. No free peritoneal air. Unremarkable bones. IMPRESSION: Stable mild changes of COPD and chronic bronchitis. No acute abnormality. Electronically Signed   By: Claudie Revering M.D.   On: 11/19/2018 17:37       Today   Subjective:    Kayla Blanchard very anxious to go home Objective:   Blood pressure 109/78, pulse 99, temperature 97.6 F (36.4 C), resp. rate (!) 24, height 5\' 2"  (1.575 m), weight 47.4 kg, last menstrual period 11/18/2018, SpO2 100 %.  .  Intake/Output Summary (Last 24 hours) at 11/21/2018 1451 Last data filed at 11/21/2018 0300 Gross per 24 hour  Intake 1280 ml  Output 400 ml  Net 880 ml    Exam VITAL SIGNS: Blood pressure 109/78, pulse 99, temperature 97.6 F (36.4 C), resp. rate (!) 24, height 5\' 2"  (1.575 m), weight 47.4 kg, last menstrual period 11/18/2018, SpO2 100 %.  GENERAL:  38 y.o.-year-old patient lying in the bed with no acute distress.  EYES: Pupils equal, round, reactive to light and accommodation. No scleral icterus. Extraocular muscles intact.  HEENT: Head atraumatic, normocephalic. Oropharynx and nasopharynx clear.  NECK:  Supple, no jugular venous distention. No thyroid enlargement, no tenderness.  LUNGS: Normal breath sounds bilaterally, no wheezing, rales,rhonchi or crepitation. No use of accessory muscles of respiration.  CARDIOVASCULAR: S1, S2 normal. No murmurs, rubs, or gallops.  ABDOMEN: Soft, nontender, nondistended. Bowel sounds present. No organomegaly or mass.  EXTREMITIES: No pedal edema, cyanosis, or clubbing.  NEUROLOGIC: Cranial nerves II through XII are intact. Muscle strength 5/5 in all extremities. Sensation intact. Gait not checked.  PSYCHIATRIC: The patient is alert and oriented x 3.  SKIN: No obvious rash, lesion, or ulcer.   Data Review     CBC w Diff:  Lab Results  Component Value Date   WBC 5.9 11/21/2018  HGB 12.1 11/21/2018   HGB 12.5 10/24/2013   HCT 36.1 11/21/2018   HCT 38.2 10/24/2013   PLT 238 11/21/2018   PLT 253 10/24/2013   LYMPHOPCT 6.9 10/24/2013   MONOPCT 10.1 10/24/2013   EOSPCT 0.6 10/24/2013   BASOPCT 0.2 10/24/2013   CMP:  Lab Results  Component Value Date   NA 139 11/21/2018   NA 136 10/24/2013   K 5.2 (H)  11/21/2018   K 3.6 10/24/2013   CL 117 (H) 11/21/2018   CL 99 10/24/2013   CO2 18 (L) 11/21/2018   CO2 27 10/24/2013   BUN 10 11/21/2018   BUN 7 10/24/2013   CREATININE 0.60 11/21/2018   CREATININE 0.81 10/24/2013   PROT 7.9 11/19/2018   PROT 7.0 10/23/2013   ALBUMIN 4.8 11/19/2018   ALBUMIN 3.0 (L) 10/23/2013   BILITOT 0.9 11/19/2018   BILITOT 0.3 10/23/2013   ALKPHOS 61 11/19/2018   ALKPHOS 87 10/23/2013   AST 32 11/19/2018   AST 20 10/23/2013   ALT 26 11/19/2018   ALT 21 10/23/2013  .  Micro Results Recent Results (from the past 240 hour(s))  SARS Coronavirus 2 [facility] order, Performed in [facility] hospital lab) Nasopharyngeal Nasopharyngeal Swab     Status: None   Collection Time: 11/19/18  7:21 PM   Specimen: Nasopharyngeal Swab  Result Value Ref Range Status   SARS Coronavirus 2 NEGATIVE NEGATIVE Final    Comment: (NOTE) If result is NEGATIVE SARS-CoV-2 target nucleic acids are NOT DETECTED. The SARS-CoV-2 RNA is generally detectable in upper and lower  respiratory specimens during the acute phase of infection. The lowest  concentration of SARS-CoV-2 viral copies this assay can detect is 250  copies / mL. A negative result does not preclude SARS-CoV-2 infection  and should not be used as the sole basis for treatment or other  patient management decisions.  A negative result may occur with  improper specimen collection / handling, submission of specimen other  than nasopharyngeal swab, presence of viral mutation(s) within the  areas targeted by this assay, and inadequate number of viral copies  (<250 copies / mL). A negative result must be combined with clinical  observations, patient history, and epidemiological information. If result is POSITIVE SARS-CoV-2 target nucleic acids are DETECTED. The SARS-CoV-2 RNA is generally detectable in upper and lower  respiratory specimens dur ing the acute phase of infection.  Positive  results are indicative of active  infection with SARS-CoV-2.  Clinical  correlation with patient history and other diagnostic information is  necessary to determine patient infection status.  Positive results do  not rule out bacterial infection or co-infection with other viruses. If result is PRESUMPTIVE POSTIVE SARS-CoV-2 nucleic acids MAY BE PRESENT.   A presumptive positive result was obtained on the submitted specimen  and confirmed on repeat testing.  While 2019 novel coronavirus  (SARS-CoV-2) nucleic acids may be present in the submitted sample  additional confirmatory testing may be necessary for epidemiological  and / or clinical management purposes  to differentiate between  SARS-CoV-2 and other Sarbecovirus currently known to infect humans.  If clinically indicated additional testing with an alternate test  methodology [phone]) is advised. The SARS-CoV-2 RNA is generally  detectable in upper and lower respiratory sp ecimens during the acute  phase of infection. The expected result is Negative. Fact Sheet for Patients:  StrictlyIdeas.no Fact Sheet for Healthcare Providers: BankingDealers.co.za This test is not yet approved or cleared by the [place] FDA and has  been authorized for detection and/or diagnosis of SARS-CoV-2 by FDA under an Emergency Use Authorization (EUA).  This EUA will remain in effect (meaning this test can be used) for the duration of the COVID-19 declaration under Section 564(b)(1) of the Act, 21 U.S.C. section 360bbb-3(b)(1), unless the authorization is terminated or revoked sooner. Performed at [facility], [address] [place], [place] [address]         Code Status Orders  (From admission, onward)         Start     Ordered   11/20/18 0017  Full code  Continuous     11/20/18 0016        Code Status History    This patient has a current code status but no historical code status.   Advance Care Planning  Activity          Follow-up Information    Lorelee Market, MD On 12/05/2018.   Specialty: Family Medicine Why: appointment at 10:30 Contact information: [place] [address] [phone]        [name], MD. Schedule an appointment as soon as possible for a visit in 2 weeks.   Specialty: Gastroenterology Why: gastritis Contact information: [address]  [place] [address] [phone]        [facility]. Schedule an appointment as soon as possible for a visit in 2 weeks.   Why: decreased ef h/o mi has been seen at [facility] before          Discharge Medications   Allergies as of 11/21/2018      Reactions   Amoxicillin Hives   Banana Other (See Comments)   Reaction: unknown   Penicillins Hives   Has patient had a PCN reaction causing immediate rash, facial/tongue/throat swelling, SOB or lightheadedness with hypotension: Yes Has patient had a PCN reaction causing severe rash involving mucus membranes or skin necrosis: No Has patient had a PCN reaction that required hospitalization No Has patient had a PCN reaction occurring within the last 10 years: No If all of the above answers are "NO", then may proceed with Cephalosporin use.      Medication List    STOP taking these medications   predniSONE 10 MG tablet Commonly known as: DELTASONE     TAKE these medications   desvenlafaxine 100 MG 24 hr tablet Commonly known as: PRISTIQ Take 100 mg by mouth daily.   pantoprazole 40 MG tablet Commonly known as: Protonix Take 1 tablet (40 mg total) by mouth 2 (two) times daily. What changed:   medication strength  how much to take  when to take this          Total Time in preparing paper work, data evaluation and todays exam - 35 minutes  Dustin Flock M.D on 11/21/2018 at 2:51 PM [place]  [phone]

## 2018-11-21 NOTE — Consult Note (Signed)
PHARMACY CONSULT NOTE - FOLLOW UP  Pharmacy Consult for Electrolyte Monitoring and Replacement   Recent Labs: Potassium (mmol/L)  Date Value  11/21/2018 5.2 (H)  10/24/2013 3.6   Calcium (mg/dL)  Date Value  11/21/2018 7.8 (L)   Calcium, Total (mg/dL)  Date Value  10/24/2013 7.6 (L)   Albumin (g/dL)  Date Value  11/19/2018 4.8  10/23/2013 3.0 (L)   Sodium (mmol/L)  Date Value  11/21/2018 139  10/24/2013 136     Assessment: Pharmacy consulted to monitor and replace electrolytes in 38yo patient. Pt was on NS infusion w/ KCl and received KCl 40 mEq x 2 PO on 9/8.   Goal of Therapy:  Electrolytes WNL  Plan:  K 5.2. NS w/ KCl d/c'ed.  Will order BMP and Mg in the AM.   Oswald Hillock ,PharmD, BCPS Clinical Pharmacist 11/21/2018 9:08 AM

## 2018-11-21 NOTE — Transfer of Care (Signed)
Immediate Anesthesia Transfer of Care Note  Patient: Kayla Blanchard  Procedure(s) Performed: ESOPHAGOGASTRODUODENOSCOPY (EGD) WITH PROPOFOL (N/A )  Patient Location: PACU  Anesthesia Type:General  Level of Consciousness: sedated  Airway & Oxygen Therapy: Patient Spontanous Breathing  Post-op Assessment: Report given to RN and Post -op Vital signs reviewed and stable  Post vital signs: Reviewed and stable  Last Vitals:  Vitals Value Taken Time  BP 106/75 11/21/18 1028  Temp 36.4 C 11/21/18 1028  Pulse 102 11/21/18 1029  Resp 15 11/21/18 1029  SpO2 100 % 11/21/18 1029  Vitals shown include unvalidated device data.  Last Pain:  Vitals:   11/21/18 1028  TempSrc:   PainSc: 0-No pain         Complications: No apparent anesthesia complications

## 2018-11-21 NOTE — Progress Notes (Signed)
Ch f/u with pt post EGD procedure. Pt was alert and oriented and presented to have a flat affect. Pt was in the process of being d/c. No further needs at this time.    11/21/18 1200  Clinical Encounter Type  Visited With Patient  Visit Type Follow-up  Spiritual Encounters  Spiritual Needs Other (Comment)  Stress Factors  Patient Stress Factors Health changes

## 2018-11-21 NOTE — Progress Notes (Signed)
Patient is very upset and using profanity this AM when I walked in.  She states she is "pissed off they are starving me and wants this (profanity) IV turned off right now!!".  IV bag due to hang until 12 today but due to increased agitation, RN turned off.

## 2018-11-21 NOTE — Progress Notes (Signed)
Patient alert and oriented, vss, no complaints of pain.  D/c home.  D/c telemtetry and piv.  No questions.  Will be escorted out of hospital via wheelchair by nursing staff

## 2018-11-21 NOTE — Anesthesia Postprocedure Evaluation (Signed)
Anesthesia Post Note  Patient: Norina Buzzard  Procedure(s) Performed: ESOPHAGOGASTRODUODENOSCOPY (EGD) WITH PROPOFOL (N/A )  Patient location during evaluation: PACU Anesthesia Type: General Level of consciousness: awake and alert Pain management: pain level controlled Vital Signs Assessment: post-procedure vital signs reviewed and stable Respiratory status: spontaneous breathing, nonlabored ventilation, respiratory function stable and patient connected to nasal cannula oxygen Cardiovascular status: blood pressure returned to baseline and stable Postop Assessment: no apparent nausea or vomiting Anesthetic complications: no     Last Vitals:  Vitals:   11/21/18 1028 11/21/18 1047  BP: 106/75 109/78  Pulse: 99   Resp: (!) 24   Temp: 36.4 C   SpO2: 100%     Last Pain:  Vitals:   11/21/18 1047  TempSrc:   PainSc: 0-No pain                 Precious Haws Piscitello

## 2018-11-21 NOTE — Anesthesia Post-op Follow-up Note (Signed)
Anesthesia QCDR form completed.        

## 2018-11-21 NOTE — Anesthesia Preprocedure Evaluation (Addendum)
Anesthesia Evaluation  Patient identified by MRN, date of birth, ID band Patient awake    Reviewed: Allergy & Precautions, H&P , NPO status , Patient's Chart, lab work & pertinent test results  History of Anesthesia Complications Negative for: history of anesthetic complications  Airway Mallampati: III  TM Distance: <3 FB Neck ROM: full    Dental  (+) Chipped   Pulmonary neg shortness of breath, COPD, Current Smoker,           Cardiovascular Exercise Tolerance: Good (-) angina+ CAD and + Past MI       Neuro/Psych PSYCHIATRIC DISORDERS negative neurological ROS     GI/Hepatic Neg liver ROS, PUD, GERD  Medicated and Controlled,  Endo/Other  negative endocrine ROS  Renal/GU negative Renal ROS  negative genitourinary   Musculoskeletal   Abdominal   Peds  Hematology negative hematology ROS (+)   Anesthesia Other Findings Actively menstruating and was unable to yield a urine hcg in ER.  Risks and benefits of not checking discussed with patient.  She desires to proceed without checking hcg and maintains that she is not sexually active  Past Medical History: No date: COPD (chronic obstructive pulmonary disease) (HCC) No date: Gastric ulcer No date: History of kidney infection No date: MI (myocardial infarction) ([place])  Past Surgical History: No date: NO PAST SURGERIES  BMI    Body Mass Index: 19.11 kg/m      Reproductive/Obstetrics negative OB ROS                            Anesthesia Physical Anesthesia Plan  ASA: III  Anesthesia Plan: General   Post-op Pain Management:    Induction: Intravenous  PONV Risk Score and Plan: Propofol infusion and TIVA  Airway Management Planned: Natural Airway and Nasal Cannula  Additional Equipment:   Intra-op Plan:   Post-operative Plan:   Informed Consent: I have reviewed the patients History and Physical, chart, labs and discussed the  procedure including the risks, benefits and alternatives for the proposed anesthesia with the patient or authorized representative who has indicated his/her understanding and acceptance.     Dental Advisory Given  Plan Discussed with: Anesthesiologist, CRNA and Surgeon  Anesthesia Plan Comments: (Patient consented for risks of anesthesia including but not limited to:  - adverse reactions to medications - risk of intubation if required - damage to teeth, lips or other oral mucosa - sore throat or hoarseness - Damage to heart, brain, lungs or loss of life  Patient voiced understanding.)        Anesthesia Quick Evaluation

## 2018-11-22 ENCOUNTER — Encounter: Payer: Self-pay | Admitting: Gastroenterology

## 2018-11-22 LAB — SURGICAL PATHOLOGY

## 2018-11-26 ENCOUNTER — Encounter: Payer: Self-pay | Admitting: Gastroenterology

## 2019-05-06 ENCOUNTER — Other Ambulatory Visit: Payer: Self-pay | Admitting: Family Medicine

## 2019-05-06 DIAGNOSIS — Z1231 Encounter for screening mammogram for malignant neoplasm of breast: Secondary | ICD-10-CM

## 2019-05-13 ENCOUNTER — Ambulatory Visit
Admission: RE | Admit: 2019-05-13 | Discharge: 2019-05-13 | Disposition: A | Discharge status: Home / Self Care | Payer: Medicaid Other | Source: Ambulatory Visit | Attending: Family Medicine | Admitting: Family Medicine

## 2019-05-13 DIAGNOSIS — Z1231 Encounter for screening mammogram for malignant neoplasm of breast: Secondary | ICD-10-CM | POA: Insufficient documentation

## 2019-05-29 ENCOUNTER — Other Ambulatory Visit: Payer: Self-pay | Admitting: Family Medicine

## 2019-05-29 DIAGNOSIS — N6489 Other specified disorders of breast: Secondary | ICD-10-CM

## 2019-05-29 DIAGNOSIS — R928 Other abnormal and inconclusive findings on diagnostic imaging of breast: Secondary | ICD-10-CM

## 2019-05-29 DIAGNOSIS — R921 Mammographic calcification found on diagnostic imaging of breast: Secondary | ICD-10-CM

## 2019-06-03 ENCOUNTER — Ambulatory Visit
Admission: RE | Admit: 2019-06-03 | Discharge: 2019-06-03 | Disposition: A | Discharge status: Home / Self Care | Payer: Medicaid Other | Source: Ambulatory Visit | Attending: Family Medicine | Admitting: Family Medicine

## 2019-06-03 DIAGNOSIS — R928 Other abnormal and inconclusive findings on diagnostic imaging of breast: Secondary | ICD-10-CM | POA: Diagnosis not present

## 2019-06-03 DIAGNOSIS — R921 Mammographic calcification found on diagnostic imaging of breast: Secondary | ICD-10-CM | POA: Insufficient documentation

## 2019-06-03 DIAGNOSIS — N6489 Other specified disorders of breast: Secondary | ICD-10-CM | POA: Insufficient documentation

## 2019-06-10 ENCOUNTER — Other Ambulatory Visit: Payer: Self-pay | Admitting: Family Medicine

## 2019-06-10 DIAGNOSIS — R921 Mammographic calcification found on diagnostic imaging of breast: Secondary | ICD-10-CM

## 2019-06-10 DIAGNOSIS — N6489 Other specified disorders of breast: Secondary | ICD-10-CM

## 2019-09-25 ENCOUNTER — Ambulatory Visit: Payer: Medicaid Other | Admitting: Dermatology

## 2019-09-25 ENCOUNTER — Encounter: Payer: Self-pay | Admitting: Dermatology

## 2019-09-25 ENCOUNTER — Other Ambulatory Visit: Payer: Self-pay

## 2019-09-25 DIAGNOSIS — D239 Other benign neoplasm of skin, unspecified: Secondary | ICD-10-CM

## 2019-09-25 DIAGNOSIS — D2372 Other benign neoplasm of skin of left lower limb, including hip: Secondary | ICD-10-CM

## 2019-09-25 NOTE — Patient Instructions (Addendum)
Recommend daily broad spectrum sunscreen SPF 30+ to sun-exposed areas, reapply every 2 hours as needed. Call for new or changing lesions.  Pre-Operative Instructions  You are scheduled for a surgical procedure at [facility]. We recommend you read the following instructions. If you have any questions or concerns, please call the office at [phone].  1. Shower and wash the entire body with soap and water the day of your surgery paying special attention to cleansing at and around the planned surgery site.  2. Avoid aspirin or aspirin containing products at least fourteen (14) days prior to your surgical procedure and for at least one week (7 Days) after your surgical procedure. If you take aspirin on a regular basis for heart disease or history of stroke or for any other reason, we may recommend you continue taking aspirin but please notify us if you take this on a regular basis. Aspirin can cause more bleeding to occur during surgery as well as prolonged bleeding and bruising after surgery.   3. Avoid other nonsteroidal pain medications at least one week prior to surgery and at least one week prior to your surgery. These include medications such as Ibuprofen (Motrin, Advil and Nuprin), Naprosyn, Voltaren, Relafen, etc. If medications are used for therapeutic reasons, please inform us as they can cause increased bleeding or prolonged bleeding during and bruising after surgical procedures.   4. Please advice us if you are taking any "blood thinner" medications such as Coumadin or Dipyridamole or Plavix or similar medications. These cause increased bleeding and prolonged bleeding during and bruising after surgical procedures. We may have to consider discontinuing these medications briefly prior to and shortly after your surgery, if safe to do so.   5. Please inform us of all medications you are currently taking. All medications that are taken regularly should be taken the day of surgery as you  always do. Nevertheless, we need to be informed of what medications you are taking prior to surgery to whether they will affect the procedure or cause any complications.   6. Please inform us of any medication allergies. Also inform us of whether you have allergies to Latex or rubber products or whether you have had any adverse reaction to Lidocaine or Epinephrine.  7. Please inform us of any prosthetic or artificial body parts such as artificial heart valve, joint replacements, etc., or similar condition that might require preoperative antibiotics.   8. We recommend avoidance of alcohol at least two weeks prior to surgery and continued avoidence for at least two weeks after surgery.   9. We recommend discontinuation of tobacco smoking at least two weeks prior to surgery and continued abstinence for at least two weeks after surgery.  10. Do not plan strenuous exercise, strenuous work or strenuous lifting for approximately four weeks after your surgery.   11. We request if you are unable to make your scheduled surgical appointment, please call us at least a week in advance or as soon as you are aware of a problem sot aht we can cancel or reschedule you.   12. You MAKE TAKE TYLENOL (acetaminophen) for pain as it is not a blood thinner.   13. PLEASE PLAN TO BE IN TOWN FOR TWO WEEKS FOLLOWING SURGERY, THIS IS IMPORTANT SO YOU CAN BE CHECKED FOR DRESSING CHANGES, SUTURE REMOVAL AND TO MONITOR FOR POSSIBLE COMPLICATIONS.  

## 2019-09-25 NOTE — Progress Notes (Signed)
   New Patient Visit  Subjective  Kayla Blanchard is a 39 y.o. female who presents for the following: area of concern (Left knee).  Patient presents today as a new patient, has area of concern on Left knee, has been present for a few years, also sends sharp pains down her left leg  The following portions of the chart were reviewed this encounter and updated as appropriate:      Review of Systems:  No other skin or systemic complaints except as noted in HPI or Assessment and Plan.  Objective  Well appearing patient in no apparent distress; mood and affect are within normal limits.  A focused examination was performed including Left Knee. Relevant physical exam findings are noted in the Assessment and Plan.  Objective  Left Upper Knee: 9 mm firm pink/brown papulenodule with dimple sign. With similar smaller nodule on right medial knee and left inferior knee  Images       Assessment & Plan  Dermatofibroma Left Upper Knee  Dermatofibroma with symptoms and/or recent change.  Discussed surgical excision to remove, including resulting scar and possible recurrence.  Patient will schedule for surgery. Pre-op information given.    Return for First Available Surgery for removal of Dermatofibroma.  Documentation: I have reviewed the above documentation for accuracy and completeness, and I agree with the above.  Brendolyn Patty MD

## 2019-10-21 ENCOUNTER — Other Ambulatory Visit: Payer: Self-pay

## 2019-10-21 ENCOUNTER — Ambulatory Visit (INDEPENDENT_AMBULATORY_CARE_PROVIDER_SITE_OTHER): Payer: Medicaid Other | Admitting: Dermatology

## 2019-10-21 DIAGNOSIS — D239 Other benign neoplasm of skin, unspecified: Secondary | ICD-10-CM | POA: Diagnosis not present

## 2019-10-21 NOTE — Patient Instructions (Signed)

## 2019-10-21 NOTE — Progress Notes (Signed)
   Follow-Up Visit   Subjective  Kayla Blanchard is a 39 y.o. female who presents for the following: Dermatofibroma (L upper knee, pt presents for excision).  It is symptomatic.   The following portions of the chart were reviewed this encounter and updated as appropriate:      Review of Systems:  No other skin or systemic complaints except as noted in HPI or Assessment and Plan.  Objective  Well appearing patient in no apparent distress; mood and affect are within normal limits.  A focused examination was performed including L knee. Relevant physical exam findings are noted in the Assessment and Plan.  Objective  Left upper knee - Anterior: 9.0 x 6.25mm firm pink flesh nodule   Assessment & Plan  Dermatofibroma Left upper knee - Anterior  Skin excision - Left upper knee - Anterior  Lesion length (cm):  0.9 Lesion width (cm):  0.6 Margin per side (cm):  0.1 Total excision diameter (cm):  1.1 Informed consent: discussed and consent obtained   Timeout: patient name, date of birth, surgical site, and procedure verified   Procedure prep:  Patient was prepped and draped in usual sterile fashion Prep type:  Povidone-iodine Anesthesia: the lesion was anesthetized in a standard fashion   Anesthesia comment:  9.0cc Anesthetic:  1% lidocaine w/ epinephrine 1-100,000 buffered w/ 8.4% NaHCO3 Instrument used: #15 blade   Hemostasis achieved with: pressure and electrodesiccation   Outcome: patient tolerated procedure well with no complications   Post-procedure details: sterile dressing applied and wound care instructions given   Dressing type: petrolatum and pressure dressing    Skin repair - Left upper knee - Anterior Complexity:  Intermediate Final length (cm):  2.7 Reason for type of repair: reduce tension to allow closure, reduce the risk of dehiscence, infection, and necrosis, reduce subcutaneous dead space and avoid a hematoma and preserve normal anatomy   Undermining:  edges could be approximated without difficulty and edges undermined   Subcutaneous layers (deep stitches):  Suture size:  3-0 Suture type: Vicryl (polyglactin 910)   Stitches:  Buried vertical mattress Fine/surface layer approximation (top stitches):  Suture size:  3-0 Suture type: nylon   Stitches: simple interrupted   Suture removal (days):  7 Hemostasis achieved with: suture Outcome: patient tolerated procedure well with no complications   Post-procedure details: sterile dressing applied and wound care instructions given   Dressing type: pressure dressing (Mupirocin)    Specimen 1 - Surgical pathology Differential Diagnosis: D48.5 Dermatofibroma vs other Check Margins: yes 9.0 x 6.6mm firm pink flesh nodule.  Return in about 1 week (around 10/28/2019) for suture removal.  I, Othelia Pulling, RMA, am acting as scribe for Brendolyn Patty, MD .

## 2019-10-22 ENCOUNTER — Telehealth: Payer: Self-pay

## 2019-10-22 NOTE — Telephone Encounter (Signed)
Pt doing fine after yesterday's surgery.  Advised pt to call if any problems otherwise we would see her next week at her post-op appointment./sh

## 2019-10-28 ENCOUNTER — Telehealth: Payer: Self-pay

## 2019-10-28 ENCOUNTER — Other Ambulatory Visit: Payer: Self-pay

## 2019-10-28 ENCOUNTER — Ambulatory Visit (INDEPENDENT_AMBULATORY_CARE_PROVIDER_SITE_OTHER): Payer: Medicaid Other | Admitting: Dermatology

## 2019-10-28 DIAGNOSIS — Z4802 Encounter for removal of sutures: Secondary | ICD-10-CM

## 2019-10-28 DIAGNOSIS — D2372 Other benign neoplasm of skin of left lower limb, including hip: Secondary | ICD-10-CM

## 2019-10-28 DIAGNOSIS — D239 Other benign neoplasm of skin, unspecified: Secondary | ICD-10-CM

## 2019-10-28 NOTE — Progress Notes (Signed)
   Follow-Up Visit   Subjective  Kayla Blanchard is a 39 y.o. female who presents for the following: suture removal (dermatofibroma - left upper knee, no problems).   The following portions of the chart were reviewed this encounter and updated as appropriate:      Review of Systems:  No other skin or systemic complaints except as noted in HPI or Assessment and Plan.  Objective  Well appearing patient in no apparent distress; mood and affect are within normal limits.  A focused examination was performed including left knee. Relevant physical exam findings are noted in the Assessment and Plan.  Objective  Left Upper Knee: Excision site healing well, no evidence of infection    Assessment & Plan  Dermatofibroma Left Upper Knee  Biopsy proven- benign, margin involved. Discussed risk of recurrence.  Wound cleansed, sutures removed, wound cleansed and steri strips applied. Discussed pathology results.   Return if symptoms worsen or fail to improve.  IJamesetta Orleans, CMA, am acting as scribe for Brendolyn Patty, MD .  Documentation: I have reviewed the above documentation for accuracy and completeness, and I agree with the above.  Brendolyn Patty MD

## 2019-10-28 NOTE — Telephone Encounter (Signed)
Patient advised excision of the left upper knee was a dermatofibroma, margin involved. Discussed risk of recurrence.

## 2020-01-09 ENCOUNTER — Ambulatory Visit
Admission: RE | Admit: 2020-01-09 | Discharge: 2020-01-09 | Disposition: A | Discharge status: Home / Self Care | Payer: Medicaid Other | Source: Ambulatory Visit | Attending: Family Medicine | Admitting: Family Medicine

## 2020-01-09 ENCOUNTER — Other Ambulatory Visit: Payer: Self-pay

## 2020-01-09 DIAGNOSIS — N6489 Other specified disorders of breast: Secondary | ICD-10-CM | POA: Insufficient documentation

## 2020-01-09 DIAGNOSIS — R921 Mammographic calcification found on diagnostic imaging of breast: Secondary | ICD-10-CM | POA: Insufficient documentation

## 2020-01-14 ENCOUNTER — Other Ambulatory Visit: Payer: Self-pay | Admitting: Family Medicine

## 2020-01-14 DIAGNOSIS — N6489 Other specified disorders of breast: Secondary | ICD-10-CM

## 2020-01-14 DIAGNOSIS — R921 Mammographic calcification found on diagnostic imaging of breast: Secondary | ICD-10-CM

## 2020-09-06 IMAGING — MG DIGITAL SCREENING BILAT W/ TOMO W/ CAD
6 of 10 series · 6 of 30 positions shown · non-contrast
Comparison: None.

CLINICAL DATA: Screening.

EXAM:
DIGITAL SCREENING BILATERAL MAMMOGRAM WITH TOMO AND CAD

[L MLO synth-2D]
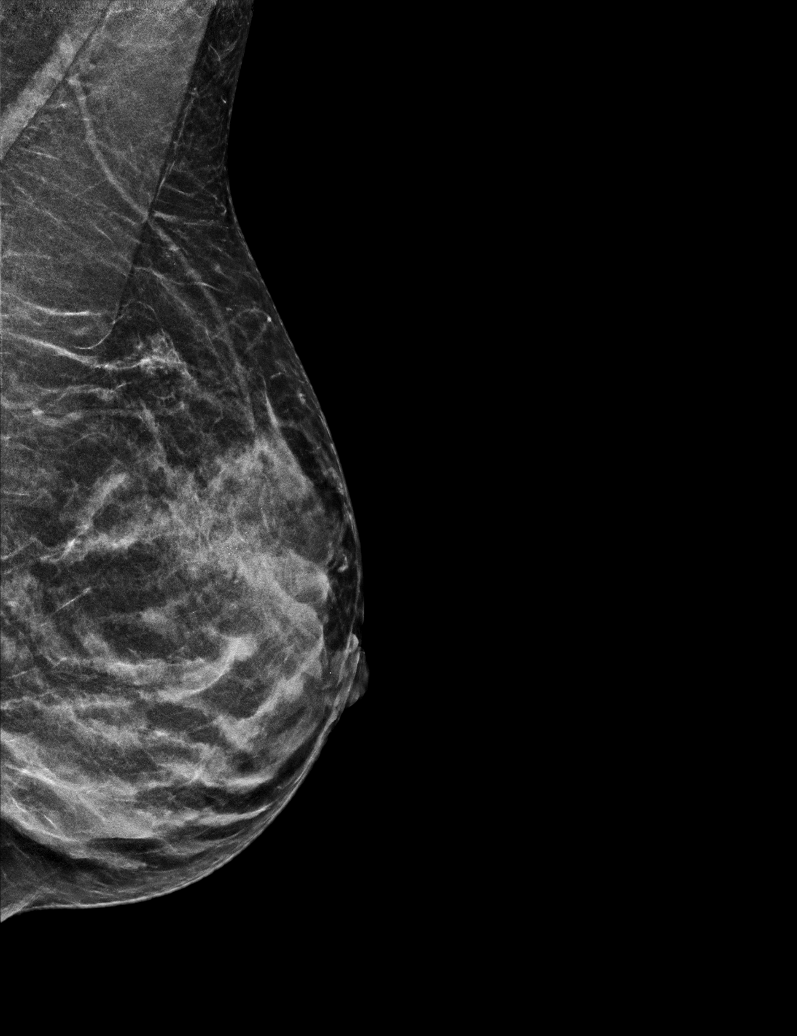

[R CC synth-2D (1 of 2)]
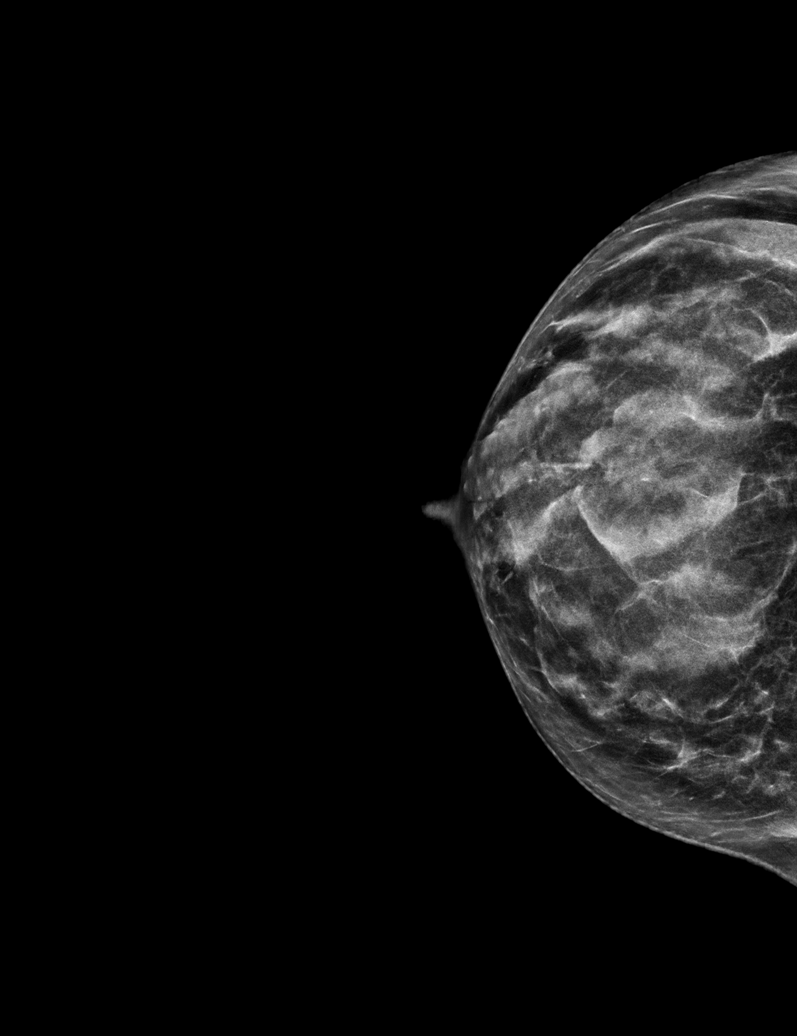

[L CC synth-2D]
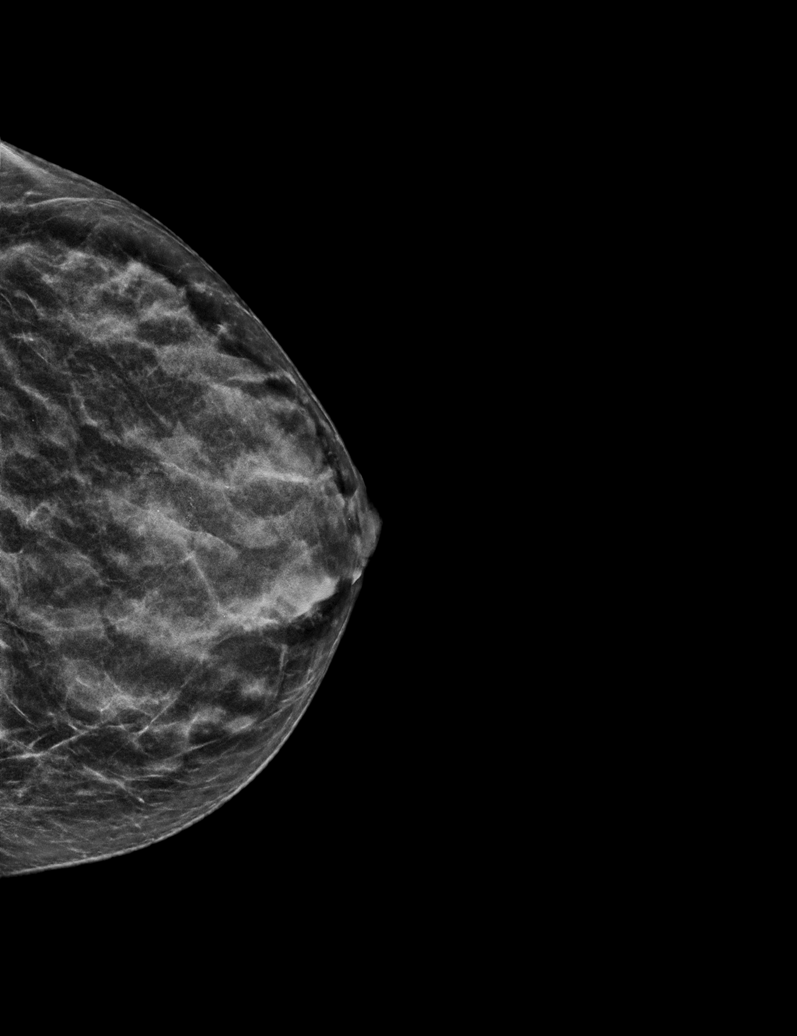

[R MLO synth-2D]
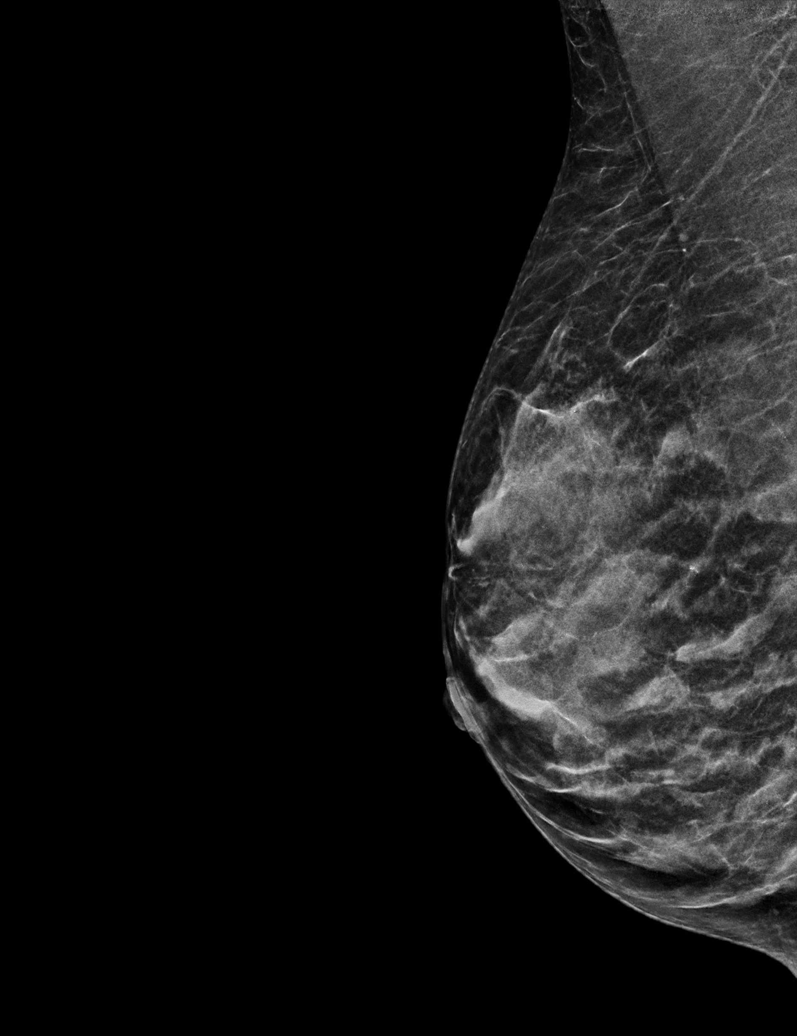

[R CC synth-2D (2 of 2)]
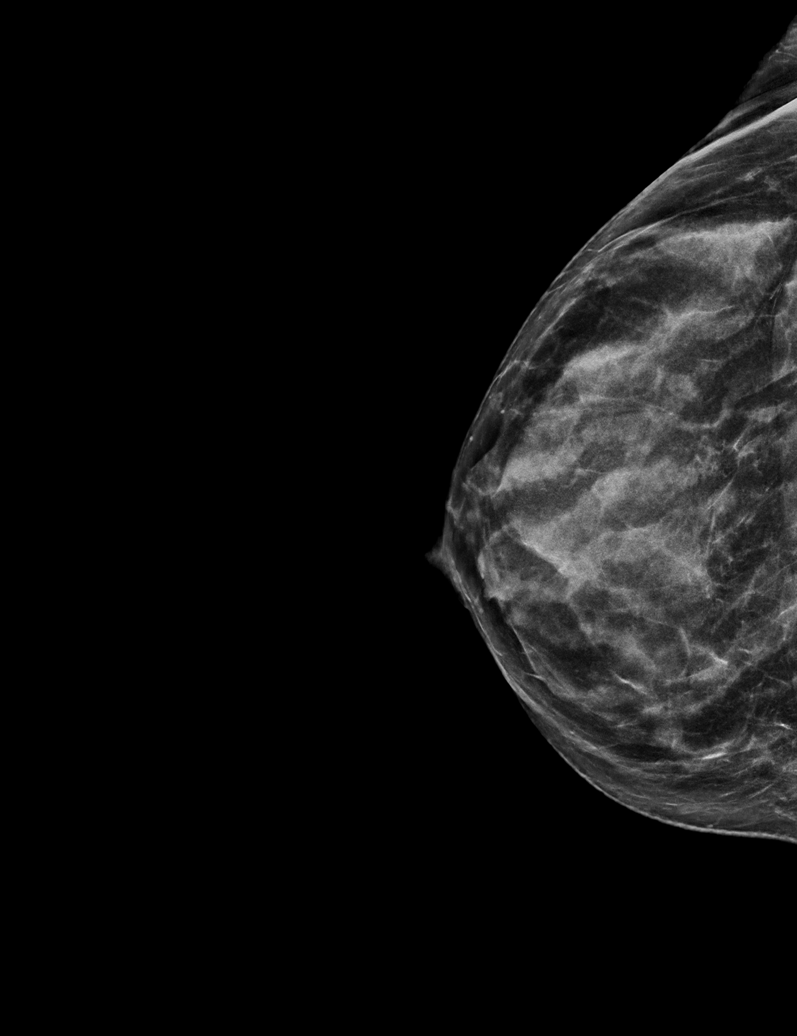

[L CC tomo · tomo slice 23/44.0]
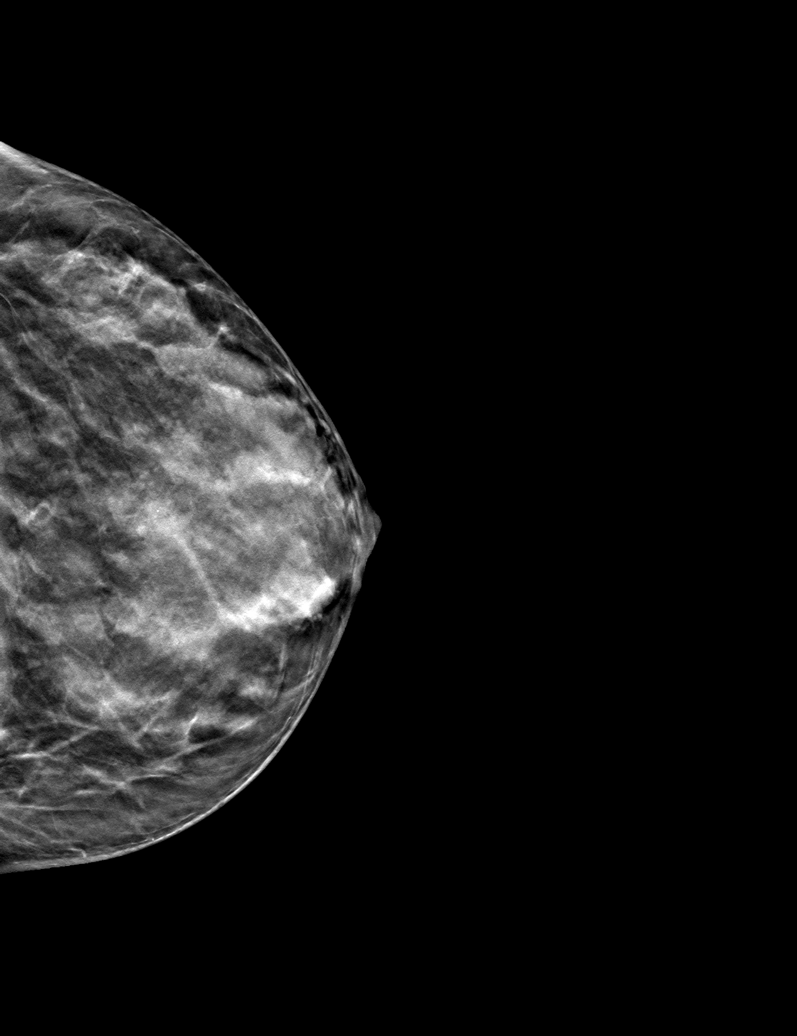

[6 of 30 positions shown; findings below may reference images not displayed]

ACR Breast Density Category d: The breast tissue is extremely dense,
which lowers the sensitivity of mammography.
FINDINGS: In the left breast, a possible asymmetry and a group of
calcifications warrants further evaluation. In the right breast, no
findings suspicious for malignancy. Images were processed with CAD.
IMPRESSION: Further evaluation is suggested for possible asymmetry and
calcifications in the left breast.

RECOMMENDATION:
Diagnostic mammogram and possibly ultrasound of the left breast.
(Code:[id number])

The patient will be contacted regarding the findings, and additional
imaging will be scheduled.

BI-RADS CATEGORY  0: Incomplete. Need additional imaging evaluation
and/or prior mammograms for comparison.

## 2022-09-25 NOTE — Progress Notes (Signed)
New patient visit  Patient: Kayla Blanchard   DOB: 08-06-1980   42 y.o. Female  MRN: [id number] Visit Date: 09/26/2022  Today's healthcare provider: Debera Lat, PA-C   Chief Complaint  Patient presents with   New Patient (Initial Visit)   Subjective    Kayla Blanchard is a 42 y.o. female who presents today as a new patient to establish care. Has to change PCP because of new insurance.  Discussed the use of AI scribe software for clinical note transcription with the patient, who gave verbal consent to proceed.  History of Present Illness   The patient, with a history of COPD, depression, and a previous myocardial infarction, presents for a new patient visit due to a change in insurance. She reports a recent onset of coughing up mucus, which she attributes to her ongoing smoking habit. She expresses a desire to quit smoking and request Chantix, although she notes that a previous generic version caused her to vomit.  She also has a history of gastric ulcers, but denies any current abdominal pain or vomiting. She reports no recent issues with kidney stones or thyroid abnormalities. She is currently taking multiple medications, including Desvenlafaxine (Pristiq), Lamictal, and a vitamin D supplement. She refers to these as her "mood stabilizers" and "crazy pills," indicating they are used for mental health management. She has been on these medications for approximately seven to eight years.  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 She has since stopped vaping. She reports drinking two glasses of alcohol daily after work.        Past Medical History:  Diagnosis Date   COPD (chronic obstructive pulmonary disease) (HCC)    Gastric ulcer    History of kidney infection    MI (myocardial infarction) (HCC)    Past Surgical History:  Procedure Laterality Date   ESOPHAGOGASTRODUODENOSCOPY (EGD) WITH  PROPOFOL N/A 11/21/2018   Procedure: ESOPHAGOGASTRODUODENOSCOPY (EGD) WITH PROPOFOL;  Surgeon: Midge Minium, MD;  Location: [facility] ENDOSCOPY;  Service: Endoscopy;  Laterality: N/A;   NO PAST SURGERIES     Family Status  Relation Name Status   Neg Hx  (Not Specified)  No partnership data on file   Family History  Problem Relation Age of Onset   Breast cancer Neg Hx    Social History   Socioeconomic History   Marital status: Single    Spouse name: Not on file   Number of children: Not on file   Years of education: Not on file   Highest education level: Not on file  Occupational History   Not on file  Tobacco Use   Smoking status: Every Day    Current packs/day: 1.00    Types: Cigarettes   Smokeless tobacco: Never  Vaping Use   Vaping status: Never Used  Substance and Sexual Activity   Alcohol use: Yes    Comment: occasional - stopped drinking 11/05/2018   Drug use: No   Sexual activity: Not on file  Other Topics Concern   Not on file  Social History Narrative   Not on file   Social Determinants of Health   Financial Resource Strain: Not on file  Food Insecurity: Not on file  Transportation Needs: Not on file  Physical Activity: Not on file  Stress: Not on file  Social Connections: Not on file   Outpatient Medications Prior to Visit  Medication Sig   cariprazine (VRAYLAR) 3 MG capsule  Take 3 mg by mouth. Take one tablet once a day   Cholecalciferol (VITAMIN D3) 1.25 MG (50000 UT) CAPS Take 1 capsule by mouth once a week.   desvenlafaxine (PRISTIQ) 100 MG 24 hr tablet Take 100 mg by mouth daily.   lamoTRIgine (LAMICTAL) 100 MG tablet Take 100 mg by mouth daily.  Taking 1 once a day   pantoprazole (PROTONIX) 40 MG tablet Take 1 tablet (40 mg total) by mouth 2 (two) times daily.   SLOW RELEASE IRON 45 MG TBCR 1 tablet daily.   traZODone (DESYREL) 50 MG tablet Take 50 mg by mouth at bedtime.   [DISCONTINUED] Iron Combinations (IRON COMPLEX PO) Take by mouth. Taking 1 one  once a day   No facility-administered medications prior to visit.   Allergies  Allergen Reactions   Amoxicillin Hives   Banana Other (See Comments)    Reaction: unknown   Penicillins Hives    Has patient had a PCN reaction causing immediate rash, facial/tongue/throat swelling, SOB or lightheadedness with hypotension: Yes Has patient had a PCN reaction causing severe rash involving mucus membranes or skin necrosis: No Has patient had a PCN reaction that required hospitalization No Has patient had a PCN reaction occurring within the last 10 years: No If all of the above answers are "NO", then may proceed with Cephalosporin use.     Immunization History  Administered Date(s) Administered   Dtap, 5 Pertussis Antigens 10/25/2010   Tdap 11/22/2010, 09/26/2022    Health Maintenance  Topic Date Due   Hepatitis C Screening  Never done   COVID-19 Vaccine (1 - 2023-24 season) Never done   INFLUENZA VACCINE  10/13/2022   PAP SMEAR-Modifier  03/23/2024   DTaP/Tdap/Td (3 - Td or Tdap) 09/25/2032   HIV Screening  Completed   HPV VACCINES  Aged Out    Patient Care Team: Debera Lat, PA-C as PCP - General (Physician Assistant)  Review of Systems Except see HPI        Objective    BP 108/74   Pulse 97   Ht 5\' 2"  (1.575 m)   Wt 110 lb 9.6 oz (50.2 kg)   LMP 09/12/2022   SpO2 96%   BMI 20.23 kg/m      Physical Exam Vitals reviewed.  Constitutional:      General: She is not in acute distress.    Appearance: Normal appearance. She is well-developed. She is not diaphoretic.  HENT:     Head: Normocephalic and atraumatic.  Eyes:     General: No scleral icterus.    Conjunctiva/sclera: Conjunctivae normal.  Neck:     Thyroid: No thyromegaly.  Cardiovascular:     Rate and Rhythm: Normal rate and regular rhythm.     Pulses: Normal pulses.     Heart sounds: Normal heart sounds. No murmur heard. Pulmonary:     Effort: Pulmonary effort is normal. No respiratory  distress.     Breath sounds: Normal breath sounds. No wheezing, rhonchi or rales.  Musculoskeletal:     Cervical back: Neck supple.     Right lower leg: No edema.     Left lower leg: No edema.  Lymphadenopathy:     Cervical: No cervical adenopathy.  Skin:    General: Skin is warm and dry.     Findings: No rash.  Neurological:     Mental Status: She is alert and oriented to person, place, and time. Mental status is at baseline.  Psychiatric:  Mood and Affect: Mood normal.        Behavior: Behavior normal.     Depression Screen     No data to display         No results found for any visits on 09/26/22.  Assessment & Plan         COPD: Chronic. Stable. Normal pulmonary PE, vitals.  Active smoker with a pack every two days. No current inhaler use. -Start Chantix to aid in smoking cessation. Monitor for adverse effects such as nausea. Will need maintenance pack in a mo.  Depression: Chronic. PHQ9 score today was 10. Denies having bipolar or seizures. On multiple antidepressants and mood stabilizers for several years. No current behavioral health specialist. -Refer to behavioral health for evaluation and potential medication adjustment. Continue taking vraylar 3 mg, desvenlafaxine 100mg , lamictal 100mg  and trazodone 50mg . Per dispense history, [facility] , last refill was on 12/31/21 #90. Lamotrigine - on 4.20/2024 #90. Trazodone 50mg  on 05/28/22 #90, no dispense hx for desvenlafaxine? Needs labs. Waiting for old records.  GERD/Gastric Ulcer: chronic.  Reports vomiting for several days at a time. No recent H. Pylori testing. -Obtain previous medical records to review past workup and treatment. -Unclear if her most recent labs/2 mo ago include H. pylori? test Continue PPI Elevate the head of the bed 6-8 inches, avoid recumbency for 3 hours after eating, avoid food as a delayed gastric emptying, weight loss    Myocardial Infarction: History of MI at age 71, potentially  related to marijuana vape pen use. No current statin use. -Review previous medical records to assess need for statin therapy.  Upper Respiratory Symptoms: Recent onset of cough and mucus production, nasal congestion -Recommend over-the-counter Mucinex, antihistamine, and nasal saline rinse. If symptoms worsen, consider Flonase.  General Health Maintenance: -Administer Tdap vaccine today. -Schedule physical exam within a month. -Obtain previous medical records for review including labs     Encounter to establish care Welcomed to our clinic Reviewed past medical hx, social hx, family hx and surgical hx Pt advised to send all vaccination records or screening  Return in about 1 month (around 10/27/2022) for CPE.    The patient was advised to call back or seek an in-person evaluation if the symptoms worsen or if the condition fails to improve as anticipated.  I discussed the assessment and treatment plan with the patient. The patient was provided an opportunity to ask questions and all were answered. The patient agreed with the plan and demonstrated an understanding of the instructions.  I, Debera Lat, PA-C have reviewed all documentation for this visit. The documentation on 09/26/22 for the exam, diagnosis, procedures, and orders are all accurate and complete.  I spent 45 Minutes caring for his patient today face to face, reviewing imaging from preferred primary care/US reports thyroid&abdomen records from another facility, performing a medically appropriate examination and /or evaluation for URI, COPD, counseling and educating the patient on smoking, depression? Her current medications for depression, documenting in the record and proceeding with Tdap vaccine.   Debera Lat, [facility], MMS [facility] [phone] (phone) [phone] (fax)  [facility]

## 2022-09-26 ENCOUNTER — Encounter: Payer: Self-pay | Admitting: Physician Assistant

## 2022-09-26 ENCOUNTER — Ambulatory Visit (INDEPENDENT_AMBULATORY_CARE_PROVIDER_SITE_OTHER): Payer: 59 | Admitting: Physician Assistant

## 2022-09-26 VITALS — BP 108/74 | HR 97 | Ht 62.0 in | Wt 110.6 lb

## 2022-09-26 DIAGNOSIS — Z7689 Persons encountering health services in other specified circumstances: Secondary | ICD-10-CM | POA: Diagnosis not present

## 2022-09-26 DIAGNOSIS — Z789 Other specified health status: Secondary | ICD-10-CM

## 2022-09-26 DIAGNOSIS — F339 Major depressive disorder, recurrent, unspecified: Secondary | ICD-10-CM | POA: Diagnosis not present

## 2022-09-26 DIAGNOSIS — E559 Vitamin D deficiency, unspecified: Secondary | ICD-10-CM | POA: Diagnosis not present

## 2022-09-26 DIAGNOSIS — R0989 Other specified symptoms and signs involving the circulatory and respiratory systems: Secondary | ICD-10-CM | POA: Diagnosis not present

## 2022-09-26 DIAGNOSIS — F17200 Nicotine dependence, unspecified, uncomplicated: Secondary | ICD-10-CM

## 2022-09-26 DIAGNOSIS — Z23 Encounter for immunization: Secondary | ICD-10-CM

## 2022-09-26 DIAGNOSIS — Z8711 Personal history of peptic ulcer disease: Secondary | ICD-10-CM | POA: Diagnosis not present

## 2022-09-26 DIAGNOSIS — I252 Old myocardial infarction: Secondary | ICD-10-CM | POA: Diagnosis not present

## 2022-09-26 DIAGNOSIS — Z8639 Personal history of other endocrine, nutritional and metabolic disease: Secondary | ICD-10-CM | POA: Diagnosis not present

## 2022-09-26 MED ORDER — VARENICLINE TARTRATE (STARTER) 0.5 MG X 11 & 1 MG X 42 PO TBPK: ORAL_TABLET | ORAL | 0 refills | Status: DC

## 2022-10-22 NOTE — Progress Notes (Unsigned)
Complete physical exam  Patient: Kayla Blanchard   DOB: 1980/10/24   42 y.o. Female  MRN: [id number] Visit Date: 10/25/2022  Today's healthcare provider: Debera Lat, PA-C   No chief complaint on file.  Subjective    Kayla Blanchard is a 41 y.o. female who presents today for a complete physical exam.  She reports consuming a {diet types:17450} diet. {Exercise:19826} She generally feels {well/fairly well/poorly:18703}. She reports sleeping {well/fairly well/poorly:18703}. She {does/does not:200015} have additional problems to discuss today.  HPI  *** Discussed the use of AI scribe software for clinical note transcription with the patient, who gave verbal consent to proceed.  History of Present Illness            Last depression screening scores     No data to display         Last fall risk screening     No data to display         Last Audit-C alcohol use screening     No data to display         A score of 3 or more in women, and 4 or more in men indicates increased risk for alcohol abuse, EXCEPT if all of the points are from question 1   Past Medical History:  Diagnosis Date   COPD (chronic obstructive pulmonary disease) (HCC)    Gastric ulcer    History of kidney infection    MI (myocardial infarction) (HCC)    Past Surgical History:  Procedure Laterality Date   ESOPHAGOGASTRODUODENOSCOPY (EGD) WITH PROPOFOL N/A 11/21/2018   Procedure: ESOPHAGOGASTRODUODENOSCOPY (EGD) WITH PROPOFOL;  Surgeon: Midge Minium, MD;  Location: [facility] ENDOSCOPY;  Service: Endoscopy;  Laterality: N/A;   NO PAST SURGERIES     Social History   Socioeconomic History   Marital status: Single    Spouse name: Not on file   Number of children: Not on file   Years of education: Not on file   Highest education level: Not on file  Occupational History   Not on file  Tobacco Use   Smoking status: Every Day    Current packs/day: 1.00    Types: Cigarettes   Smokeless  tobacco: Never  Vaping Use   Vaping status: Never Used  Substance and Sexual Activity   Alcohol use: Yes    Comment: occasional - stopped drinking 11/05/2018   Drug use: No   Sexual activity: Not on file  Other Topics Concern   Not on file  Social History Narrative   Not on file   Social Determinants of Health   Financial Resource Strain: Not on file  Food Insecurity: Not on file  Transportation Needs: Not on file  Physical Activity: Not on file  Stress: Not on file  Social Connections: Not on file  Intimate Partner Violence: Not on file   Family Status  Relation Name Status   Neg Hx  (Not Specified)  No partnership data on file   Family History  Problem Relation Age of Onset   Breast cancer Neg Hx    Allergies  Allergen Reactions   Amoxicillin Hives   Banana Other (See Comments)    Reaction: unknown   Penicillins Hives    Has patient had a PCN reaction causing immediate rash, facial/tongue/throat swelling, SOB or lightheadedness with hypotension: Yes Has patient had a PCN reaction causing severe rash involving mucus membranes or skin necrosis: No Has patient had a PCN reaction that required hospitalization No Has patient  had a PCN reaction occurring within the last 10 years: No If all of the above answers are "NO", then may proceed with Cephalosporin use.     Patient Care Team: Cherlynn Polo as PCP - General (Physician Assistant)   Medications: Outpatient Medications Prior to Visit  Medication Sig   cariprazine (VRAYLAR) 3 MG capsule Take 3 mg by mouth. Take one tablet once a day   Cholecalciferol (VITAMIN D3) 1.25 MG (50000 UT) CAPS Take 1 capsule by mouth once a week.   desvenlafaxine (PRISTIQ) 100 MG 24 hr tablet Take 100 mg by mouth daily.   lamoTRIgine (LAMICTAL) 100 MG tablet Take 100 mg by mouth daily.  Taking 1 once a day   pantoprazole (PROTONIX) 40 MG tablet Take 1 tablet (40 mg total) by mouth 2 (two) times daily.   SLOW RELEASE IRON 45 MG  TBCR 1 tablet daily.   traZODone (DESYREL) 50 MG tablet Take 50 mg by mouth at bedtime.   Varenicline Tartrate, Starter, (CHANTIX STARTING MONTH PAK) 0.5 MG X 11 & 1 MG X 42 TBPK Varenicline Tartrate, Starter, (CHANTIX STARTING MONTH PAK) 0.5 MG X 11 & 1 MG X 42 TBPK; Take 1 mg by mouth 2 (two) times daily. Start one 0.5 mg tablet daily for 3 days, then take one 0.5 mg tablet twice daily for 4 days, then take one 1 mg tablet twice daily Dispense: 53 each; Refill: 0 Send in maintenance dose in 4 weeks.   No facility-administered medications prior to visit.    Review of Systems  All other systems reviewed and are negative.  Except see HPI  {Insert previous labs (optional):23779} {See past labs  Heme  Chem  Endocrine  Serology  Results Review (optional):1}  Objective    LMP 09/12/2022  {Insert last BP/Wt (optional):23777}{See vitals history (optional):1}    Physical Exam Vitals reviewed.  Constitutional:      General: She is not in acute distress.    Appearance: Normal appearance. She is well-developed. She is not ill-appearing, toxic-appearing or diaphoretic.  HENT:     Head: Normocephalic and atraumatic.     Right Ear: Tympanic membrane, ear canal and external ear normal.     Left Ear: Tympanic membrane, ear canal and external ear normal.     Nose: Nose normal. No congestion or rhinorrhea.     Mouth/Throat:     Mouth: Mucous membranes are moist.     Pharynx: Oropharynx is clear. No oropharyngeal exudate.  Eyes:     General: No scleral icterus.       Right eye: No discharge.        Left eye: No discharge.     Conjunctiva/sclera: Conjunctivae normal.     Pupils: Pupils are equal, round, and reactive to light.  Neck:     Thyroid: No thyromegaly.     Vascular: No carotid bruit.  Cardiovascular:     Rate and Rhythm: Normal rate and regular rhythm.     Pulses: Normal pulses.     Heart sounds: Normal heart sounds. No murmur heard.    No friction rub. No gallop.   Pulmonary:     Effort: Pulmonary effort is normal. No respiratory distress.     Breath sounds: Normal breath sounds. No wheezing or rales.  Abdominal:     General: Abdomen is flat. Bowel sounds are normal. There is no distension.     Palpations: Abdomen is soft. There is no mass.     Tenderness: There is no abdominal  tenderness. There is no right CVA tenderness, left CVA tenderness, guarding or rebound.     Hernia: No hernia is present.  Musculoskeletal:        General: No swelling, tenderness, deformity or signs of injury. Normal range of motion.     Cervical back: Normal range of motion and neck supple. No rigidity or tenderness.     Right lower leg: No edema.     Left lower leg: No edema.  Lymphadenopathy:     Cervical: No cervical adenopathy.  Skin:    General: Skin is warm and dry.     Coloration: Skin is not jaundiced or pale.     Findings: No bruising, erythema, lesion or rash.  Neurological:     Mental Status: She is alert and oriented to person, place, and time. Mental status is at baseline.     Gait: Gait normal.  Psychiatric:        Mood and Affect: Mood normal.        Behavior: Behavior normal.        Thought Content: Thought content normal.        Judgment: Judgment normal.      No results found for any visits on 10/25/22.  Assessment & Plan    Routine Health Maintenance and Physical Exam  Exercise Activities and Dietary recommendations  Goals   None     Immunization History  Administered Date(s) Administered   Dtap, 5 Pertussis Antigens 10/25/2010   Tdap 11/22/2010, 09/26/2022    Health Maintenance  Topic Date Due   Hepatitis C Screening  Never done   COVID-19 Vaccine (1 - 2023-24 season) Never done   Flu Shot  10/13/2022   Pap Smear  03/23/2024   DTaP/Tdap/Td vaccine (3 - Td or Tdap) 09/25/2032   HIV Screening  Completed   HPV Vaccine  Aged Out    Discussed health benefits of physical activity, and encouraged her to engage in regular exercise  appropriate for her age and condition. Annual physical exam UTD on dental/eye Things to do to keep yourself healthy  - Exercise at least 30-45 minutes a day, 3-4 days a week.  - Eat a low-fat diet with lots of fruits and vegetables, up to 7-9 servings per day.  - Seatbelts can save your life. Wear them always.  - Smoke detectors on every level of your home, check batteries every year.  - Eye Doctor - have an eye exam every 1-2 years  - Safe sex - if you may be exposed to STDs, use a condom.  - Alcohol -  If you drink, do it moderately, less than 2 drinks per day.  - Health Care Power of Attorney. Choose someone to speak for you if you are not able.  - Depression is common in our stressful world.If you're feeling down or losing interest in things you normally enjoy, please come in for a visit.  - Violence - If anyone is threatening or hurting you, please call immediately.   No follow-ups on file.    The patient was advised to call back or seek an in-person evaluation if the symptoms worsen or if the condition fails to improve as anticipated.  I discussed the assessment and treatment plan with the patient. The patient was provided an opportunity to ask questions and all were answered. The patient agreed with the plan and demonstrated an understanding of the instructions.  I, Debera Lat, PA-C have reviewed all documentation for this visit. The documentation on 10/25/22  for the exam, diagnosis, procedures, and orders are all accurate and complete.  Debera Lat, [facility], MMS [facility] [phone] (phone) [phone] (fax)  [facility]

## 2022-10-25 ENCOUNTER — Ambulatory Visit (INDEPENDENT_AMBULATORY_CARE_PROVIDER_SITE_OTHER): Payer: 59 | Admitting: Physician Assistant

## 2022-10-25 ENCOUNTER — Encounter: Payer: Self-pay | Admitting: Physician Assistant

## 2022-10-25 VITALS — BP 103/82 | HR 128 | Ht 62.0 in | Wt 102.6 lb

## 2022-10-25 DIAGNOSIS — Z789 Other specified health status: Secondary | ICD-10-CM | POA: Diagnosis not present

## 2022-10-25 DIAGNOSIS — F339 Major depressive disorder, recurrent, unspecified: Secondary | ICD-10-CM

## 2022-10-25 DIAGNOSIS — F17200 Nicotine dependence, unspecified, uncomplicated: Secondary | ICD-10-CM | POA: Diagnosis not present

## 2022-10-25 DIAGNOSIS — Z0000 Encounter for general adult medical examination without abnormal findings: Secondary | ICD-10-CM | POA: Diagnosis not present

## 2022-10-25 DIAGNOSIS — Z13228 Encounter for screening for other metabolic disorders: Secondary | ICD-10-CM | POA: Diagnosis not present

## 2022-10-25 DIAGNOSIS — Z1329 Encounter for screening for other suspected endocrine disorder: Secondary | ICD-10-CM

## 2022-10-25 DIAGNOSIS — R8290 Unspecified abnormal findings in urine: Secondary | ICD-10-CM

## 2022-10-25 DIAGNOSIS — Z1389 Encounter for screening for other disorder: Secondary | ICD-10-CM

## 2022-10-25 DIAGNOSIS — Z136 Encounter for screening for cardiovascular disorders: Secondary | ICD-10-CM | POA: Diagnosis not present

## 2022-10-25 DIAGNOSIS — K219 Gastro-esophageal reflux disease without esophagitis: Secondary | ICD-10-CM

## 2022-10-25 DIAGNOSIS — Z1159 Encounter for screening for other viral diseases: Secondary | ICD-10-CM

## 2022-10-25 DIAGNOSIS — Z130 Encounter for screening for diseases of the blood and blood-forming organs and certain disorders involving the immune mechanism: Secondary | ICD-10-CM

## 2022-10-25 DIAGNOSIS — I252 Old myocardial infarction: Secondary | ICD-10-CM | POA: Diagnosis not present

## 2022-10-25 LAB — POCT URINALYSIS DIPSTICK
Blood, UA: POSITIVE
Glucose, UA: NEGATIVE
Nitrite, UA: NEGATIVE
Protein, UA: POSITIVE — AB
Spec Grav, UA: <=1.005 — AB (ref 1.010–1.025)
Urobilinogen, UA: 0.2 E.U./dL
pH, UA: 6 (ref 5.0–8.0)

## 2022-10-27 ENCOUNTER — Telehealth: Payer: Self-pay

## 2022-10-27 ENCOUNTER — Other Ambulatory Visit: Payer: Self-pay | Admitting: Physician Assistant

## 2022-10-27 DIAGNOSIS — Z1159 Encounter for screening for other viral diseases: Secondary | ICD-10-CM | POA: Insufficient documentation

## 2022-10-27 DIAGNOSIS — N3001 Acute cystitis with hematuria: Secondary | ICD-10-CM

## 2022-10-27 DIAGNOSIS — Z1329 Encounter for screening for other suspected endocrine disorder: Secondary | ICD-10-CM | POA: Insufficient documentation

## 2022-10-27 DIAGNOSIS — Z1389 Encounter for screening for other disorder: Secondary | ICD-10-CM | POA: Insufficient documentation

## 2022-10-27 DIAGNOSIS — I252 Old myocardial infarction: Secondary | ICD-10-CM | POA: Insufficient documentation

## 2022-10-27 DIAGNOSIS — Z13228 Encounter for screening for other metabolic disorders: Secondary | ICD-10-CM | POA: Insufficient documentation

## 2022-10-27 DIAGNOSIS — Z130 Encounter for screening for diseases of the blood and blood-forming organs and certain disorders involving the immune mechanism: Secondary | ICD-10-CM | POA: Insufficient documentation

## 2022-10-27 DIAGNOSIS — Z136 Encounter for screening for cardiovascular disorders: Secondary | ICD-10-CM | POA: Insufficient documentation

## 2022-10-27 DIAGNOSIS — R8290 Unspecified abnormal findings in urine: Secondary | ICD-10-CM | POA: Insufficient documentation

## 2022-10-27 DIAGNOSIS — F17200 Nicotine dependence, unspecified, uncomplicated: Secondary | ICD-10-CM | POA: Insufficient documentation

## 2022-10-27 DIAGNOSIS — Z789 Other specified health status: Secondary | ICD-10-CM | POA: Insufficient documentation

## 2022-10-27 MED ORDER — NITROFURANTOIN MONOHYD MACRO 100 MG PO CAPS: 100.0000 mg | ORAL_CAPSULE | Freq: Two times a day (BID) | ORAL | 0 refills | Status: DC

## 2022-10-27 NOTE — Telephone Encounter (Signed)
-----   Message from Debera Lat sent at 10/27/2022  8:11 AM EDT ----- Your preliminary urine culture test showed that you have a bacteria and positive for UTI. I will call an antibiotic while waiting for the final results.

## 2022-10-27 NOTE — Progress Notes (Signed)
Your preliminary urine culture test showed that you have a bacteria and positive for UTI. I will call an antibiotic while waiting for the final results.

## 2022-10-27 NOTE — Telephone Encounter (Signed)
Called patient to go over lab results

## 2022-10-28 ENCOUNTER — Telehealth: Payer: Self-pay

## 2022-10-28 LAB — SPECIMEN STATUS REPORT

## 2022-10-28 NOTE — Telephone Encounter (Signed)
Patient has confirmed that she is picking up the antibiotic today

## 2022-10-28 NOTE — Telephone Encounter (Signed)
-----   Message from [facility] sent at 10/27/2022  9:22 AM EDT ----- Left message for patient to return call ----- Message ----- From: Debera Lat, PA-C Sent: 10/27/2022   8:11 AM EDT To: [facility] Nurse  Your preliminary urine culture test showed that you have a bacteria and positive for UTI. I will call an antibiotic while waiting for the final results.

## 2022-11-09 ENCOUNTER — Other Ambulatory Visit: Payer: Self-pay | Admitting: Physician Assistant

## 2022-11-09 DIAGNOSIS — F17200 Nicotine dependence, unspecified, uncomplicated: Secondary | ICD-10-CM

## 2022-11-10 MED ORDER — VARENICLINE TARTRATE 1 MG PO TABS: 1.0000 mg | ORAL_TABLET | Freq: Two times a day (BID) | ORAL | 0 refills | Status: DC

## 2023-01-02 NOTE — Progress Notes (Unsigned)
  Established patient visit  Patient: Kayla Blanchard   DOB: [birth year]   42 y.o. Female  MRN: [id number] Visit Date: 01/03/2023  Today's healthcare provider: Debera Lat, PA-C   No chief complaint on file.  Subjective    HPI  *** Discussed the use of AI scribe software for clinical note transcription with the patient, who gave verbal consent to proceed.  History of Present Illness                No data to display             No data to display          Medications: Outpatient Medications Prior to Visit  Medication Sig   cariprazine (VRAYLAR) 3 MG capsule Take 3 mg by mouth. Take one tablet once a day   Cholecalciferol (VITAMIN D3) 1.25 MG (50000 UT) CAPS Take 1 capsule by mouth once a week.   desvenlafaxine (PRISTIQ) 100 MG 24 hr tablet Take 100 mg by mouth daily.   lamoTRIgine (LAMICTAL) 100 MG tablet Take 100 mg by mouth daily.  Taking 1 once a day   nitrofurantoin, macrocrystal-monohydrate, (MACROBID) 100 MG capsule Take 1 capsule (100 mg total) by mouth 2 (two) times daily.   pantoprazole (PROTONIX) 40 MG tablet Take 1 tablet (40 mg total) by mouth 2 (two) times daily.   SLOW RELEASE IRON 45 MG TBCR 1 tablet daily.   traZODone (DESYREL) 50 MG tablet Take 50 mg by mouth at bedtime.   varenicline (CHANTIX) 1 MG tablet Take 1 tablet (1 mg total) by mouth 2 (two) times daily.   No facility-administered medications prior to visit.    Review of Systems Except see HPI   {Insert previous labs (optional):23779} {See past labs  Heme  Chem  Endocrine  Serology  Results Review (optional):1}   Objective    There were no vitals taken for this visit. {Insert last BP/Wt (optional):23777}{See vitals history (optional):1}   Physical Exam   No results found for any visits on 01/03/23.  Assessment & Plan    *** Assessment and Plan              No follow-ups on file.      [facility]

## 2023-01-03 ENCOUNTER — Ambulatory Visit (INDEPENDENT_AMBULATORY_CARE_PROVIDER_SITE_OTHER): Payer: 59 | Admitting: Physician Assistant

## 2023-01-03 ENCOUNTER — Encounter: Payer: Self-pay | Admitting: Physician Assistant

## 2023-01-03 VITALS — BP 121/81 | HR 81 | Ht 62.0 in | Wt 111.0 lb

## 2023-01-03 DIAGNOSIS — L723 Sebaceous cyst: Secondary | ICD-10-CM | POA: Diagnosis not present

## 2023-01-03 MED ORDER — SULFAMETHOXAZOLE-TRIMETHOPRIM 800-160 MG PO TABS
1.0000 | ORAL_TABLET | Freq: Two times a day (BID) | ORAL | 0 refills | Status: AC
Start: 2023-01-03 — End: 2023-01-10

## 2023-01-04 ENCOUNTER — Telehealth: Payer: Self-pay

## 2023-01-04 DIAGNOSIS — L723 Sebaceous cyst: Secondary | ICD-10-CM

## 2023-01-04 NOTE — Telephone Encounter (Signed)
Copied from CRM [id number]. Topic: Referral - Request for Referral >> Jan 03, 2023  1:59 PM Phill Myron wrote: Ms Penuelas stated she will need a referral to Dr Willeen Niece  [email or web address]. Please advise

## 2023-01-05 ENCOUNTER — Ambulatory Visit (INDEPENDENT_AMBULATORY_CARE_PROVIDER_SITE_OTHER): Payer: 59 | Admitting: Physician Assistant

## 2023-01-05 DIAGNOSIS — Z91199 Patient's noncompliance with other medical treatment and regimen due to unspecified reason: Secondary | ICD-10-CM

## 2023-01-05 NOTE — Progress Notes (Signed)
Patient was not seen for appt d/t no call, no show, or late arrival >10 mins past appt time.    Debera Lat PA [facility] [address] [place], [place] [address] [phone] (phone) [phone] (fax) [facility]

## 2023-02-13 ENCOUNTER — Encounter: Payer: Self-pay | Admitting: Dermatology

## 2023-02-13 ENCOUNTER — Ambulatory Visit (INDEPENDENT_AMBULATORY_CARE_PROVIDER_SITE_OTHER): Payer: 59 | Admitting: Dermatology

## 2023-02-13 DIAGNOSIS — L720 Epidermal cyst: Secondary | ICD-10-CM

## 2023-02-13 MED ORDER — DOXYCYCLINE MONOHYDRATE 100 MG PO CAPS: 100.0000 mg | ORAL_CAPSULE | Freq: Two times a day (BID) | ORAL | 0 refills | Status: AC

## 2023-02-13 NOTE — Patient Instructions (Signed)
Treatment Plan: Start doxycycline 100 mg bid with food x 2 weeks.   Doxycycline should be taken with food to prevent nausea. Do not lay down for 30 minutes after taking. Be cautious with sun exposure and use good sun protection while on this medication. Pregnant women should not take this medication.   Due to recent changes in healthcare laws, you may see results of your pathology and/or laboratory studies on MyChart before the doctors have had a chance to review them. We understand that in some cases there may be results that are confusing or concerning to you. Please understand that not all results are received at the same time and often the doctors may need to interpret multiple results in order to provide you with the best plan of care or course of treatment. Therefore, we ask that you please give [place] 2 business days to thoroughly review all your results before contacting the office for clarification. Should we see a critical lab result, you will be contacted sooner.   If You Need Anything After Your Visit  If you have any questions or concerns for your doctor, please call our main line at [phone] and press option 4 to reach your doctor's medical assistant. If no one answers, please leave a voicemail as directed and we will return your call as soon as possible. Messages left after 4 pm will be answered the following business day.   You may also send [place] a message via MyChart. We typically respond to MyChart messages within 1-2 business days.  For prescription refills, please ask your pharmacy to contact our office. Our fax number is [phone].  If you have an urgent issue when the clinic is closed that cannot wait until the next business day, you can page your doctor at the number below.    Please note that while we do our best to be available for urgent issues outside of office hours, we are not available 24/7.   If you have an urgent issue and are unable to reach [place], you may choose to  seek medical care at your doctor's office, retail clinic, urgent care center, or emergency room.  If you have a medical emergency, please immediately call 911 or go to the emergency department.  Pager Numbers  - Dr. Gwen Pounds: [phone]  - Dr. Roseanne Reno: [phone]  - Dr. Katrinka Blazing: [phone]   In the event of inclement weather, please call our main line at [phone] for an update on the status of any delays or closures.  Dermatology Medication Tips: Please keep the boxes that topical medications come in in order to help keep track of the instructions about where and how to use these. Pharmacies typically print the medication instructions only on the boxes and not directly on the medication tubes.   If your medication is too expensive, please contact our office at [phone] option 4 or send [place] a message through MyChart.   We are unable to tell what your co-pay for medications will be in advance as this is different depending on your insurance coverage. However, we may be able to find a substitute medication at lower cost or fill out paperwork to get insurance to cover a needed medication.   If a prior authorization is required to get your medication covered by your insurance company, please allow [place] 1-2 business days to complete this process.  Drug prices often vary depending on where the prescription is filled and some pharmacies may offer cheaper prices.  The website www.goodrx.com contains  coupons for medications through different pharmacies. The prices here do not account for what the cost may be with help from insurance (it may be cheaper with your insurance), but the website can give you the price if you did not use any insurance.  - You can print the associated coupon and take it with your prescription to the pharmacy.  - You may also stop by our office during regular business hours and pick up a GoodRx coupon card.  - If you need your prescription sent electronically to a  different pharmacy, notify our office through [facility] or by phone at [phone] option 4.     Si Usted Necesita Algo Despus de Su Visita  Tambin puede enviarnos un mensaje a travs de Clinical cytogeneticist. Por lo general respondemos a los mensajes de MyChart en el transcurso de 1 a 2 das hbiles.  Para renovar recetas, por favor pida a su farmacia que se ponga en contacto con nuestra oficina. Annie Sable de fax es [place] [phone].  Si tiene un asunto urgente cuando la clnica est cerrada y que no puede esperar hasta el siguiente da hbil, puede llamar/localizar a su doctor(a) al nmero que aparece a continuacin.   Por favor, tenga en cuenta que aunque hacemos todo lo posible para estar disponibles para asuntos urgentes fuera del horario de [place], no estamos disponibles las 24 horas del da, los 7 [address]  [address] de la [place].   Si tiene un problema urgente y no puede comunicarse con nosotros, puede optar por buscar atencin mdica  en el consultorio de su doctor(a), en una clnica privada, en un centro de atencin urgente o en una sala de emergencias.  Si tiene Engineer, drilling, por favor llame inmediatamente al 911 o vaya a la sala de emergencias.  Nmeros de bper  - Dr. Gwen Pounds: [phone]  - Dra. Roseanne Reno: [phone]  - Dr. Katrinka Blazing: [phone]   En caso de inclemencias del tiempo, por favor llame a Lacy Duverney principal al [phone] para una actualizacin sobre el [place] de cualquier retraso o cierre.  Consejos para la medicacin en dermatologa: Por favor, guarde las cajas en las que vienen los medicamentos de uso tpico para ayudarle a seguir las instrucciones sobre dnde y cmo usarlos. Las farmacias generalmente imprimen las instrucciones del medicamento slo en las cajas y no directamente en los tubos del [place].   Si su medicamento es muy caro, por favor, pngase en contacto con Rolm Gala llamando al [phone] y presione la opcin 4 o envenos un  mensaje a travs de Clinical cytogeneticist.   No podemos decirle cul ser su copago por los medicamentos por adelantado ya que esto es diferente dependiendo de la cobertura de su seguro. Sin embargo, es posible que podamos encontrar un medicamento sustituto a Audiological scientist un formulario para que el seguro cubra el medicamento que se considera necesario.   Si se requiere una autorizacin previa para que su compaa de seguros [place] su medicamento, por favor permtanos de 1 a 2 das hbiles para completar [address]  Los precios de los medicamentos varan con frecuencia dependiendo del Environmental consultant de dnde se surte la receta y alguna farmacias pueden ofrecer precios ms baratos.  El sitio web www.goodrx.com tiene cupones para medicamentos de Health and safety inspector. Los precios aqu no tienen en cuenta lo que podra costar con la ayuda del seguro (puede ser ms barato con su seguro), pero el sitio web puede darle el precio si no utiliz Tourist information centre manager.  - Puede imprimir el  cupn correspondiente y llevarlo con su receta a la farmacia.  - Tambin puede pasar por nuestra oficina durante el horario de atencin regular y Education officer, museum una tarjeta de cupones de GoodRx.  - Si necesita que su receta se enve electrnicamente a una farmacia diferente, informe a nuestra oficina a travs de MyChart de [place] o por telfono llamando al [phone] y presione la opcin 4.

## 2023-02-13 NOTE — Progress Notes (Signed)
   New Patient Visit   Subjective  Kayla Blanchard is a 42 y.o. female who presents for the following: cyst at right buttocks. Present for years but has become tender, no drainage.   The patient has spots, moles and lesions to be evaluated, some may be new or changing and the patient may have concern these could be cancer.   The following portions of the chart were reviewed this encounter and updated as appropriate: medications, allergies, medical history  Review of Systems:  No other skin or systemic complaints except as noted in HPI or Assessment and Plan.  Objective  Well appearing patient in no apparent distress; mood and affect are within normal limits.   A focused examination was performed of the following areas: buttocks  Relevant exam findings are noted in the Assessment and Plan.    Assessment & Plan   EPIDERMAL INCLUSION CYST, inflamed Exam: ~2 cm Subcutaneous mobile compressible nodule at right medial buttock  Benign-appearing. Exam most consistent with an epidermal inclusion cyst. Discussed that a cyst is a benign growth that can grow over time and sometimes get irritated or inflamed. Recommend observation if it is not bothersome. Discussed option of surgical excision to remove it if it is growing, symptomatic, or other changes noted. Please call for new or changing lesions so they can be evaluated.  Treatment Plan: Start doxycycline 100 mg bid with food x 2 weeks.  Patient opts to return for excision after treatment  Doxycycline should be taken with food to prevent nausea. Do not lay down for 30 minutes after taking. Be cautious with sun exposure and use good sun protection while on this medication. Pregnant women should not take this medication.   Epidermal cyst  Related Medications doxycycline (MONODOX) 100 MG capsule Take 1 capsule (100 mg total) by mouth 2 (two) times daily for 14 days. Take with food.    Return for Surgery for cyst, with Dr.  Katrinka Blazing.  Anise Salvo, RMA, am acting as scribe for Elie Goody, MD .   Documentation: I have reviewed the above documentation for accuracy and completeness, and I agree with the above.  Elie Goody, MD

## 2023-04-12 ENCOUNTER — Encounter: Payer: Self-pay | Admitting: Dermatology

## 2023-04-12 ENCOUNTER — Ambulatory Visit (INDEPENDENT_AMBULATORY_CARE_PROVIDER_SITE_OTHER): Payer: 59 | Admitting: Dermatology

## 2023-04-12 DIAGNOSIS — L720 Epidermal cyst: Secondary | ICD-10-CM | POA: Diagnosis not present

## 2023-04-12 MED ORDER — MUPIROCIN 2 % EX OINT: 1.0000 | TOPICAL_OINTMENT | Freq: Every day | CUTANEOUS | 0 refills | Status: DC

## 2023-04-12 NOTE — Patient Instructions (Signed)

## 2023-04-12 NOTE — Progress Notes (Signed)
   Follow-Up Visit   Subjective  Kayla Blanchard is a 43 y.o. female who presents for the following: Excision of cyst at right medial buttock  The following portions of the chart were reviewed this encounter and updated as appropriate: medications, allergies, medical history  Review of Systems:  No other skin or systemic complaints except as noted in HPI or Assessment and Plan.  Objective  Well appearing patient in no apparent distress; mood and affect are within normal limits.  A focused examination was performed of the following areas: buttocks Relevant physical exam findings are noted in the Assessment and Plan.   Right Medial Buttock 2 cm Subcutaneous mobile compressible nodule  Assessment & Plan   EPIDERMOID CYST Right Medial Buttock Skin excision - Right Medial Buttock  Total excision diameter (cm):  2 Informed consent: discussed and consent obtained   Timeout: patient name, date of birth, surgical site, and procedure verified   Procedure prep:  Patient was prepped and draped in usual sterile fashion Prep type:  Chlorhexidine Anesthesia: the lesion was anesthetized in a standard fashion   Anesthetic:  1% lidocaine w/ epinephrine 1-100,000 buffered w/ 8.4% NaHCO3 (6 cc lido w/epi, 6 cc bupivicaine) Instrument used: #15 blade   Hemostasis achieved with: pressure   Outcome: patient tolerated procedure well with no complications    Skin repair - Right Medial Buttock Complexity:  Intermediate Final length (cm):  4 Informed consent: discussed and consent obtained   Timeout: patient name, date of birth, surgical site, and procedure verified   Procedure prep:  Patient was prepped and draped in usual sterile fashion Prep type:  Chlorhexidine Anesthesia: the lesion was anesthetized in a standard fashion   Anesthetic:  1% lidocaine w/ epinephrine 1-100,000 buffered w/ 8.4% NaHCO3 Reason for type of repair: reduce tension to allow closure, reduce the risk of dehiscence,  infection, and necrosis, reduce subcutaneous dead space and avoid a hematoma, allow closure of the large defect and preserve normal anatomy   Undermining: edges could be approximated without difficulty   Subcutaneous layers (deep stitches):  Suture size:  3-0 Suture type: Monocryl (poliglecaprone 25)   Stitches:  Buried vertical mattress Fine/surface layer approximation (top stitches):  Suture size:  4-0 Suture type: Prolene (polypropylene)   Stitches: simple running   Suture removal (days):  7 Hemostasis achieved with: suture, pressure and electrodesiccation Outcome: patient tolerated procedure well with no complications   Post-procedure details: sterile dressing applied and wound care instructions given   Dressing type: petrolatum, bandage and pressure dressing   Specimen 1 - Surgical pathology Differential Diagnosis: Cyst vs Other  Check Margins: No 2 cm Subcutaneous mobile compressible nodule   Return in about 1 week (around 04/19/2023) for Suture Removal, with Dr. Katrinka Blazing.  Anise Salvo, RMA, am acting as scribe for Elie Goody, MD .   Documentation: I have reviewed the above documentation for accuracy and completeness, and I agree with the above.  Elie Goody, MD

## 2023-04-17 ENCOUNTER — Encounter: Payer: Self-pay | Admitting: Dermatology

## 2023-04-17 LAB — SURGICAL PATHOLOGY

## 2023-04-19 ENCOUNTER — Encounter: Payer: Self-pay | Admitting: Dermatology

## 2023-04-19 ENCOUNTER — Ambulatory Visit (INDEPENDENT_AMBULATORY_CARE_PROVIDER_SITE_OTHER): Payer: 59 | Admitting: Dermatology

## 2023-04-19 DIAGNOSIS — Z4802 Encounter for removal of sutures: Secondary | ICD-10-CM

## 2023-04-19 DIAGNOSIS — Z5189 Encounter for other specified aftercare: Secondary | ICD-10-CM

## 2023-04-19 NOTE — Patient Instructions (Signed)

## 2023-04-19 NOTE — Progress Notes (Signed)
   Follow-Up Visit   Subjective  Kayla Blanchard is a 43 y.o. female who presents for the following: Suture removal- right medial buttock biopsy proven  EPIDERMOID CYST, patient report no problems area seems to be healing well.   Pathology showed EPIDERMOID CYST   The following portions of the chart were reviewed this encounter and updated as appropriate: medications, allergies, medical history  Review of Systems:  No other skin or systemic complaints except as noted in HPI or Assessment and Plan.  Objective  Well appearing patient in no apparent distress; mood and affect are within normal limits.  Areas Examined:buttock   Relevant physical exam findings are noted in the Assessment and Plan.    Assessment & Plan   VISIT FOR WOUND CHECK   ENCOUNTER FOR REMOVAL OF SUTURES   Encounter for Removal of Sutures - Incision site is clean, dry and intact. - Wound cleansed, sutures removed, wound cleansed and steri strips applied.  - Discussed pathology results showing EPIDERMOID CYST  - Patient advised to keep steri-strips dry until they fall off. - Scars remodel for a full year. - Once steri-strips fall off, patient can apply over-the-counter silicone scar cream once to twice a day to help with scar remodeling if desired. - Patient advised to call with any concerns or if they notice any new or changing lesions.  Return if symptoms worsen or fail to improve.  IFay Kirks, CMA, am acting as scribe for Boneta Sharps, MD .   Documentation: I have reviewed the above documentation for accuracy and completeness, and I agree with the above.  Boneta Sharps, MD

## 2023-05-22 ENCOUNTER — Ambulatory Visit (INDEPENDENT_AMBULATORY_CARE_PROVIDER_SITE_OTHER): Admitting: Psychiatry

## 2023-05-22 ENCOUNTER — Encounter: Payer: Self-pay | Admitting: Psychiatry

## 2023-05-22 VITALS — BP 118/82 | HR 106 | Temp 98.1°F | Ht 62.0 in | Wt 111.0 lb

## 2023-05-22 DIAGNOSIS — F411 Generalized anxiety disorder: Secondary | ICD-10-CM | POA: Diagnosis not present

## 2023-05-22 DIAGNOSIS — F325 Major depressive disorder, single episode, in full remission: Secondary | ICD-10-CM

## 2023-05-22 MED ORDER — TRAZODONE HCL 50 MG PO TABS: 50.0000 mg | ORAL_TABLET | Freq: Every day | ORAL | 2 refills | Status: DC

## 2023-05-22 MED ORDER — CARIPRAZINE HCL 3 MG PO CAPS: 3.0000 mg | ORAL_CAPSULE | Freq: Every day | ORAL | 2 refills | Status: DC

## 2023-05-22 MED ORDER — LAMOTRIGINE 100 MG PO TABS: 100.0000 mg | ORAL_TABLET | Freq: Every day | ORAL | 2 refills | Status: DC
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
## 2023-08-02 ENCOUNTER — Telehealth: Payer: Self-pay | Admitting: Psychiatry

## 2023-08-02 ENCOUNTER — Other Ambulatory Visit: Payer: Self-pay | Admitting: Psychiatry

## 2023-08-02 ENCOUNTER — Other Ambulatory Visit: Payer: Self-pay | Admitting: Physician Assistant

## 2023-08-02 DIAGNOSIS — F411 Generalized anxiety disorder: Secondary | ICD-10-CM

## 2023-08-02 DIAGNOSIS — F325 Major depressive disorder, single episode, in full remission: Secondary | ICD-10-CM

## 2023-08-02 MED ORDER — CARIPRAZINE HCL 3 MG PO CAPS: 3.0000 mg | ORAL_CAPSULE | Freq: Every day | ORAL | 2 refills | Status: DC

## 2023-08-02 NOTE — Telephone Encounter (Signed)
 Pt was seen in October, last labs were done in August and were abnormal. Pt needs an appointment before a Rx for chantix  could be placed

## 2023-08-02 NOTE — Telephone Encounter (Signed)
 Patient left message stating that she tried to get a refill on medication but pharmacy could not refill stating there were none but on her bottle it says 2 refills. Please call to verify. She did not say what medication

## 2023-08-22 ENCOUNTER — Telehealth: Admitting: Psychiatry

## 2023-08-22 DIAGNOSIS — F411 Generalized anxiety disorder: Secondary | ICD-10-CM | POA: Diagnosis not present

## 2023-08-22 DIAGNOSIS — F325 Major depressive disorder, single episode, in full remission: Secondary | ICD-10-CM | POA: Diagnosis not present

## 2023-08-22 MED ORDER — CARIPRAZINE HCL 3 MG PO CAPS: 3.0000 mg | ORAL_CAPSULE | Freq: Every day | ORAL | 1 refills | Status: DC

## 2023-08-22 MED ORDER — LAMOTRIGINE 100 MG PO TABS: 100.0000 mg | ORAL_TABLET | Freq: Every day | ORAL | 1 refills | Status: DC

## 2023-08-22 MED ORDER — TRAZODONE HCL 50 MG PO TABS: 50.0000 mg | ORAL_TABLET | Freq: Every day | ORAL | 1 refills | Status: DC

## 2023-08-22 NOTE — Progress Notes (Addendum)
 [facility] MD/PA/NP OP Progress Note  08/22/2023 9:33 AM Kayla Blanchard  MRN:  [id number]  Chief Complaint: Routine Follow-up  Virtual Visit via Video Note  I connected with Kayla Blanchard on 08/22/23 at  9:30 AM EDT by a video enabled telemedicine application and verified that I am speaking with the correct person using two identifiers.  Location: Patient: [address]  [place] [address]  Provider: [facility]   I discussed the limitations of evaluation and management by telemedicine and the availability of in person appointments. The patient expressed understanding and agreed to proceed.    I discussed the assessment and treatment plan with the patient. The patient was provided an opportunity to ask questions and all were answered. The patient agreed with the plan and demonstrated an understanding of the instructions.   The patient was advised to call back or seek an in-person evaluation if the symptoms worsen or if the condition fails to improve as anticipated.  I provided 20 minutes of non-face-to-face time during this encounter.   Arlana Labor, NP    HPI: 43 year old female presenting to [facility] for follow-up.  Patient reports she is doing much better now stating that she finally got her car packs stating that she got her privileges reinstated by the DMV.  Patient was very happy and smiling during the interview and stating that her life is feeling so much better and the medications she is on is maintaining stating that she needs no changes at this time.  Patient reports good sleep and good appetite.  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  Patient reports that she just needs refills in which she was provided refills for Vraylar  3 mg once daily trazodone  50 mg once daily and Lamictal  100 mg once daily.  Patient is in agreement with treatment plan and states that she has no changes  at this time and is happy that she is being managed appropriately on her medications.  Patient with no other questions or concerns at this time patient denies SI, HI, AVH.  Patient will follow up in 3 months virtually. Visit Diagnosis:    ICD-10-CM   1. 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 Generalized anxiety disorder  F41.1       Past Psychiatric History:  Previous Psych Hospitalizations: - Denies  Outpatient treatment:  - Current PCP is Derald Flattery PA-C  Medications Current: - Vraylar  3 mg once daily -Lamictal  100 mg once daily - Trazodone  50 mg once daily before bed Next Steps: - Patient currently on maintenance Medication Trials: - Denies Suicide & Violence: - Denies SI, HI, AVH Substance Use: - Denies Psychotherapy: - Not recommended at this time Legal:  - Denies  Past Medical History:  Past Medical History:  Diagnosis Date   COPD (chronic obstructive pulmonary disease) (HCC)    Gastric ulcer    History of kidney infection    MI (myocardial infarction) (HCC)     Past Surgical History:  Procedure Laterality Date   ESOPHAGOGASTRODUODENOSCOPY (EGD) WITH PROPOFOL  N/A 11/21/2018   Procedure: ESOPHAGOGASTRODUODENOSCOPY (EGD) WITH PROPOFOL ;  Surgeon: Marnee Sink, MD;  Location: [facility] ENDOSCOPY;  Service: Endoscopy;  Laterality: N/A;   NO PAST SURGERIES      Family Psychiatric History: No additional  Family History:  Family History  Problem Relation Age of Onset   Breast cancer Neg Hx  Social History:  Social History   Socioeconomic History   Marital status: Single    Spouse name: Not on file   Number of children: 2   Years of education: Not on file   Highest education level: Some college, no degree  Occupational History   Not on file  Tobacco Use   Smoking status: Every Day    Current packs/day: 0.50    Types: Cigarettes   Smokeless tobacco: Never  Vaping Use   Vaping status: Never Used  Substance and Sexual Activity   Alcohol  use: Yes    Alcohol/week: 14.0 standard drinks of alcohol    Types: 14 Glasses of wine per week    Comment: 2 glasses of wine daily   Drug use: No   Sexual activity: Yes  Other Topics Concern   Not on file  Social History Narrative   Not on file   Social Drivers of Health   Financial Resource Strain: Not on file  Food Insecurity: Not on file  Transportation Needs: Not on file  Physical Activity: Not on file  Stress: Not on file  Social Connections: Not on file    Allergies:  Allergies  Allergen Reactions   Amoxicillin Hives   Banana Other (See Comments)    Reaction: unknown   Penicillins Hives    Has patient had a PCN reaction causing immediate rash, facial/tongue/throat swelling, SOB or lightheadedness with hypotension: Yes Has patient had a PCN reaction causing severe rash involving mucus membranes or skin necrosis: No Has patient had a PCN reaction that required hospitalization No Has patient had a PCN reaction occurring within the last 10 years: No If all of the above answers are NO, then may proceed with Cephalosporin use.     Metabolic Disorder Labs: No results found for: HGBA1C, MPG No results found for: PROLACTIN Lab Results  Component Value Date   CHOL 272 (H) 10/25/2022   TRIG 191 (H) 10/25/2022   HDL 87 10/25/2022   CHOLHDL 3.1 10/25/2022   LDLCALC 151 (H) 10/25/2022   Lab Results  Component Value Date   TSH 1.100 10/25/2022    Therapeutic Level Labs: No results found for: LITHIUM No results found for: VALPROATE No results found for: CBMZ  Current Medications: Current Outpatient Medications  Medication Sig Dispense Refill   cariprazine  (VRAYLAR ) 3 MG capsule Take 1 capsule (3 mg total) by mouth daily. Take one tablet once a day 30 capsule 2   Cholecalciferol (VITAMIN D3) 1.25 MG (50000 UT) CAPS Take 1 capsule by mouth once a week.     lamoTRIgine  (LAMICTAL ) 100 MG tablet Take 1 tablet (100 mg total) by mouth daily.  Taking 1 once  a day 30 tablet 2   pantoprazole  (PROTONIX ) 40 MG tablet Take 1 tablet (40 mg total) by mouth 2 (two) times daily. 60 tablet 2   SLOW RELEASE IRON 45 MG TBCR 1 tablet daily.     traZODone  (DESYREL ) 50 MG tablet Take 1 tablet (50 mg total) by mouth at bedtime. 30 tablet 2   No current facility-administered medications for this visit.     Musculoskeletal: Strength & Muscle Tone: within normal limits Gait & Station: normal Patient leans: N/A  Psychiatric Specialty Exam: Review of Systems  Constitutional: Negative.   HENT: Negative.    Eyes: Negative.   Respiratory: Negative.    Cardiovascular: Negative.   Gastrointestinal: Negative.   Endocrine: Negative.   Genitourinary: Negative.   Musculoskeletal: Negative.   Skin: Negative.   Allergic/Immunologic:  Negative.   Neurological: Negative.   Hematological: Negative.   Psychiatric/Behavioral: Negative.      There were no vitals taken for this visit.There is no height or weight on file to calculate BMI.  General Appearance: Well Groomed  Eye Contact:  Good  Speech:  Clear and Coherent  Volume:  Normal  Mood:  Euthymic  Affect:  Appropriate  Thought Process:  Coherent  Orientation:  Full (Time, Place, and Person)  Thought Content: Logical   Suicidal Thoughts:  No  Homicidal Thoughts:  No  Memory:  Immediate;   Good Recent;   Good Remote;   Good  Judgement:  Good  Insight:  Good  Psychomotor Activity:  Normal  Concentration:  Concentration: Good and Attention Span: Good  Recall:  Good  Fund of Knowledge: Good  Language: Good  Akathisia:  No  Handed:  Right  AIMS (if indicated):   Assets:  Desire for Improvement Financial Resources/Insurance Housing  ADL's:  Intact  Cognition: WNL  Sleep:  Good   Screenings: GAD-7    Flowsheet Row Office Visit from 01/03/2023 in [facility]  Total GAD-7 Score 0      PHQ2-9    Flowsheet Row Office Visit from 05/22/2023 in [facility] Office Visit from 01/03/2023 in [facility]  PHQ-2 Total Score 1 0  PHQ-9 Total Score -- 0      Flowsheet Row Office Visit from 05/22/2023 in [facility]  C-SSRS RISK CATEGORY No Risk         Assessment and Plan:    Assessment - Diagnosis:  Depression, major, in remission (HCC) [F32.5]  Generalized anxiety disorder [F41.1]    Differential: Bipolar Disorder   - Progress: Baseline assessment completed today.  - Risk Factors: No risk factors identified at this time, patient denies alcohol use and reports no recent relapse, stating sober since 2010.  Also denies firearms in the home.  Denies SI, HI.   Plan - Medications:  Continue Vraylar  3 mg p.o. once daily continued from previous prescription, patient reports stability and satisfaction and dosage Continue Lamictal  100 mg p.o. once daily continued from previous prescription, patient reports stability and satisfaction with dosage Continue Trazodone  50 mg p.o. once daily at night continue from previous prescription, patient reports stability and satisfaction with dosage.  Patient also advised to notify provider if patient is taking more than prescribed amount. - Labs: CMP, triglycerides ordered for Vraylar  for monitoring. Pt encouraged to have labs redrawn from PCP.   - Psychotherapy: No psychotherapy recommended at this time - Education: Patient was educated on the presentations and asked symptoms to monitor for depression and anxiety.  Patient was notified to reach out to provider on MyChart should the patient have symptoms.  Patient was also educated to get labs to monitor metabolic labs as well as triglycerides due to taking Vraylar  - Follow-Up: Patient will follow every 3 months for medication monitoring, patient was also educated to reach out should patient experience any symptoms before next visit. - Referrals: No referrals  needed at this time - Safety Planning: Patient currently states no SI, HI, patient also endorses that there are no firearms in the home   Patient/Guardian was advised Release of Information must be obtained prior to any record release in order to collaborate their care with an outside provider. Patient/Guardian was advised if they have not already done so to contact the registration department to sign  all necessary forms in order for us  to release information regarding their care.   Consent: Patient/Guardian gives verbal consent for treatment and assignment of benefits for services provided during this visit. Patient/Guardian expressed understanding and agreed to proceed.    Arlana Labor, NP 08/22/2023, 9:33 AM

## 2023-11-27 ENCOUNTER — Telehealth (INDEPENDENT_AMBULATORY_CARE_PROVIDER_SITE_OTHER): Admitting: Psychiatry

## 2023-11-27 ENCOUNTER — Telehealth: Admitting: Psychiatry

## 2023-11-27 DIAGNOSIS — F411 Generalized anxiety disorder: Secondary | ICD-10-CM

## 2023-11-27 DIAGNOSIS — F325 Major depressive disorder, single episode, in full remission: Secondary | ICD-10-CM | POA: Diagnosis not present

## 2023-11-27 NOTE — Progress Notes (Signed)
 BH MD/PA/NP OP Progress Note  11/27/2023 9:01 AM [name]  MRN:  [id number]  Chief Complaint: No chief complaint on file.  Virtual Visit via Video Note  I connected with Kayla Blanchard on 11/27/23 at  9:00 AM EDT by a video enabled telemedicine application and verified that I am speaking with the correct person using two identifiers.  Location: Patient: [employer] Provider: [facility]   I discussed the limitations of evaluation and management by telemedicine and the availability of in person appointments. The patient expressed understanding and agreed to proceed.    I discussed the assessment and treatment plan with the patient. The patient was provided an opportunity to ask questions and all were answered. The patient agreed with the plan and demonstrated an understanding of the instructions.   The patient was advised to call back or seek an in-person evaluation if the symptoms worsen or if the condition fails to improve as anticipated.  I provided 20 minutes of non-face-to-face time during this encounter.   Dorn Jama Der, NP    HPI: 43 year old female presenting to [facility] for follow-up.  Patient reports that she is doing well stating that she has gotten her driver's license back and she is actually being bothered by a lot of family members to drive them around.  Patient reports that all in all she is in good mood reporting a consistent mood since our last meeting 3 months ago.  Patient reports she continues to work at [employer] with her partner and states that she is more than happy with her current medication regimen and states the medications are working appropriately.  Patient still reports good sleep and is reporting that the Vraylar  and the Lamictal  are doing well.  Patient with no recommendations for changes.  Patient is agreement with treatment plan.  Patient denies SI, HI, AVH.  Patient to follow-up in 3 months.  Visit Diagnosis:     ICD-10-CM   1. 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 Generalized anxiety disorder  F41.1       Past Psychiatric History: Previous Psych Hospitalizations: - Denies   Outpatient treatment:  - Current PCP is Clarita Spencer PA-C   Medications Current: - Vraylar  3 mg once daily -Lamictal  100 mg once daily - Trazodone  50 mg once daily before bed Next Steps: - Patient currently on maintenance Medication Trials: - Denies Suicide & Violence: - Denies SI, HI, AVH Substance Use: - Denies Psychotherapy: - Not recommended at this time Legal:  - Denies  Past Medical History:  Past Medical History:  Diagnosis Date   COPD (chronic obstructive pulmonary disease) (HCC)    Gastric ulcer    History of kidney infection    MI (myocardial infarction) (HCC)     Past Surgical History:  Procedure Laterality Date   ESOPHAGOGASTRODUODENOSCOPY (EGD) WITH PROPOFOL  N/A 11/21/2018   Procedure: ESOPHAGOGASTRODUODENOSCOPY (EGD) WITH PROPOFOL ;  Surgeon: Jinny Carmine, MD;  Location: [facility] ENDOSCOPY;  Service: Endoscopy;  Laterality: N/A;   NO PAST SURGERIES      Family Psychiatric History: No additional  Family History:  Family History  Problem Relation Age of Onset   Breast cancer Neg Hx     Social History:  Social History   Socioeconomic History   Marital status: Single    Spouse name: Not on file   Number of children: 2   Years of education: Not on file   Highest education level: Some college,  no degree  Occupational History   Not on file  Tobacco Use   Smoking status: Every Day    Current packs/day: 0.50    Types: Cigarettes   Smokeless tobacco: Never  Vaping Use   Vaping status: Never Used  Substance and Sexual Activity   Alcohol use: Yes    Alcohol/week: 14.0 standard drinks of alcohol    Types: 14 Glasses of wine per week    Comment: 2 glasses of wine daily   Drug use: No   Sexual activity: Yes  Other Topics Concern   Not on file  Social History Narrative    Not on file   Social Drivers of Health   Financial Resource Strain: Not on file  Food Insecurity: Not on file  Transportation Needs: Not on file  Physical Activity: Not on file  Stress: Not on file  Social Connections: Not on file    Allergies:  Allergies  Allergen Reactions   Amoxicillin Hives   Banana Other (See Comments)    Reaction: unknown   Penicillins Hives    Has patient had a PCN reaction causing immediate rash, facial/tongue/throat swelling, SOB or lightheadedness with hypotension: Yes Has patient had a PCN reaction causing severe rash involving mucus membranes or skin necrosis: No Has patient had a PCN reaction that required hospitalization No Has patient had a PCN reaction occurring within the last 10 years: No If all of the above answers are NO, then may proceed with Cephalosporin use.     Metabolic Disorder Labs: No results found for: HGBA1C, MPG No results found for: PROLACTIN Lab Results  Component Value Date   CHOL 272 (H) 10/25/2022   TRIG 191 (H) 10/25/2022   HDL 87 10/25/2022   CHOLHDL 3.1 10/25/2022   LDLCALC 151 (H) 10/25/2022   Lab Results  Component Value Date   TSH 1.100 10/25/2022    Therapeutic Level Labs: No results found for: LITHIUM No results found for: VALPROATE No results found for: CBMZ  Current Medications: Current Outpatient Medications  Medication Sig Dispense Refill   cariprazine  (VRAYLAR ) 3 MG capsule Take 1 capsule (3 mg total) by mouth daily. Take one tablet once a day 90 capsule 1   Cholecalciferol (VITAMIN D3) 1.25 MG (50000 UT) CAPS Take 1 capsule by mouth once a week.     lamoTRIgine  (LAMICTAL ) 100 MG tablet Take 1 tablet (100 mg total) by mouth daily.  Taking 1 once a day 90 tablet 1   pantoprazole  (PROTONIX ) 40 MG tablet Take 1 tablet (40 mg total) by mouth 2 (two) times daily. 60 tablet 2   SLOW RELEASE IRON 45 MG TBCR 1 tablet daily.     traZODone  (DESYREL ) 50 MG tablet Take 1 tablet (50 mg  total) by mouth at bedtime. 90 tablet 1   No current facility-administered medications for this visit.     Musculoskeletal: Strength & Muscle Tone: within normal limits Gait & Station: normal Patient leans: N/A   Psychiatric Specialty Exam: Review of Systems  Constitutional: Negative.   HENT: Negative.    Eyes: Negative.   Respiratory: Negative.    Cardiovascular: Negative.   Gastrointestinal: Negative.   Endocrine: Negative.   Genitourinary: Negative.   Musculoskeletal: Negative.   Skin: Negative.   Allergic/Immunologic: Negative.   Neurological: Negative.   Hematological: Negative.   Psychiatric/Behavioral: Negative.       There were no vitals taken for this visit.There is no height or weight on file to calculate BMI.  General Appearance: Well Groomed  Eye Contact:  Good  Speech:  Clear and Coherent  Volume:  Normal  Mood:  Euthymic  Affect:  Appropriate  Thought Process:  Coherent  Orientation:  Full (Time, Place, and Person)  Thought Content: Logical   Suicidal Thoughts:  No  Homicidal Thoughts:  No  Memory:  Immediate;   Good Recent;   Good Remote;   Good  Judgement:  Good  Insight:  Good  Psychomotor Activity:  Normal  Concentration:  Concentration: Good and Attention Span: Good  Recall:  Good  Fund of Knowledge: Good  Language: Good  Akathisia:  No  Handed:  Right  AIMS (if indicated):   Assets:  Desire for Improvement Financial Resources/Insurance Housing  ADL's:  Intact  Cognition: WNL  Sleep:  Good   Screenings: GAD-7    Flowsheet Row Video Visit from 08/22/2023 in [facility] Office Visit from 01/03/2023 in [facility]  Total GAD-7 Score 0 0   PHQ2-9    Flowsheet Row Video Visit from 08/22/2023 in [facility] Office Visit from 05/22/2023 in [facility] Office Visit from 01/03/2023 in [facility]  PHQ-2 Total Score 0 1 0  PHQ-9 Total Score -- -- 0   Flowsheet Row Video Visit from 08/22/2023 in [facility] Office Visit from 05/22/2023 in [facility]  C-SSRS RISK CATEGORY No Risk No Risk     Assessment and Plan:  Assessment - Diagnosis:  Depression, major, in remission (HCC) [F32.5]  Generalized anxiety disorder [F41.1]    Differential: Bipolar Disorder   - Progress: Baseline assessment completed today.  - Risk Factors: No risk factors identified at this time, patient denies alcohol use and reports no recent relapse, stating sober since 2010.  Also denies firearms in the home.  Denies SI, HI.   Plan - Medications:  Continue Vraylar  3 mg p.o. once daily continued from previous prescription, patient reports stability and satisfaction and dosage Continue Lamictal  100 mg p.o. once daily continued from previous prescription, patient reports stability and satisfaction with dosage Continue Trazodone  50 mg p.o. once daily at night continue from previous prescription, patient reports stability and satisfaction with dosage.  Patient also advised to notify provider if patient is taking more than prescribed amount. - Labs: CMP, triglycerides ordered for Vraylar  for monitoring. Pt encouraged to have labs redrawn from PCP.   - Psychotherapy: No psychotherapy recommended at this time - Education: Patient was educated on the presentations and asked symptoms to monitor for depression and anxiety.  Patient was notified to reach out to provider on MyChart should the patient have symptoms.  Patient was also educated to get labs to monitor metabolic labs as well as triglycerides due to taking Vraylar  - Follow-Up: Patient will follow every 3 months for medication monitoring, patient was also educated to reach out should patient experience any symptoms before next visit. - Referrals: No  referrals needed at this time - Safety Planning: Patient currently states no SI, HI, patient also endorses that there are no firearms in the home  Patient/Guardian was advised Release of Information must be obtained prior to any record release in order to collaborate their care with an outside provider. Patient/Guardian was advised if they have not already done so to contact the registration department to sign all necessary forms in order for us  to release information regarding their care.   Consent: Patient/Guardian  gives verbal consent for treatment and assignment of benefits for services provided during this visit. Patient/Guardian expressed understanding and agreed to proceed.    Dorn Jama Der, NP 11/27/2023, 9:01 AM

## 2024-02-09 NOTE — Progress Notes (Deleted)
 Complete physical exam  Patient: Kayla Blanchard   DOB: 10-Oct-1980   43 y.o. Female  MRN: [id number] Visit Date: 02/15/2024  Today's healthcare provider: Jolynn Spencer, PA-C   No chief complaint on file.  Subjective    Kayla Blanchard is a 43 y.o. female who presents today for a complete physical exam.  She reports consuming a {diet types:17450} diet. {Exercise:19826} She generally feels {well/fairly well/poorly:18703}. She reports sleeping {well/fairly well/poorly:18703}. She {does/does not:200015} have additional problems to discuss today.  HPI  *** Discussed the use of AI scribe software for clinical note transcription with the patient, who gave verbal consent to proceed.  History of Present Illness     Last depression screening scores    08/22/2023    9:56 AM 05/22/2023   10:44 AM 01/03/2023    1:15 PM  PHQ 2/9 Scores  PHQ - 2 Score   0  PHQ- 9 Score   0      Information is confidential and restricted. Go to Review Flowsheets to unlock data.   Data saved with a previous flowsheet row definition   Last fall risk screening     No data to display         Last Audit-C alcohol use screening     No data to display         A score of 3 or more in women, and 4 or more in men indicates increased risk for alcohol abuse, EXCEPT if all of the points are from question 1   Past Medical History:  Diagnosis Date  . COPD (chronic obstructive pulmonary disease) (HCC)   . Gastric ulcer   . History of kidney infection   . MI (myocardial infarction) [facility])    Past Surgical History:  Procedure Laterality Date  . ESOPHAGOGASTRODUODENOSCOPY (EGD) WITH PROPOFOL  N/A 11/21/2018   Procedure: ESOPHAGOGASTRODUODENOSCOPY (EGD) WITH PROPOFOL ;  Surgeon: Jinny Carmine, MD;  Location: [facility] ENDOSCOPY;  Service: Endoscopy;  Laterality: N/A;  . NO PAST SURGERIES     Social History   Socioeconomic History  . Marital status: Single    Spouse name: Not on file  . Number of  children: 2  . Years of education: Not on file  . Highest education level: Some college, no degree  Occupational History  . Not on file  Tobacco Use  . Smoking status: Every Day    Current packs/day: 0.50    Types: Cigarettes  . Smokeless tobacco: Never  Vaping Use  . Vaping status: Never Used  Substance and Sexual Activity  . Alcohol use: Yes    Alcohol/week: 14.0 standard drinks of alcohol    Types: 14 Glasses of wine per week    Comment: 2 glasses of wine daily  . Drug use: No  . Sexual activity: Yes  Other Topics Concern  . Not on file  Social History Narrative  . Not on file   Social Drivers of Health   Financial Resource Strain: Not on file  Food Insecurity: Not on file  Transportation Needs: Not on file  Physical Activity: Not on file  Stress: Not on file  Social Connections: Not on file  Intimate Partner Violence: Not on file   Family Status  Relation Name Status  . Mother  Deceased  . Father  Alive  . Brother  Alive  . H Sister  Alive  . Neg Hx  (Not Specified)  No partnership data on file   Family History  Problem Relation Age  of Onset  . Breast cancer Neg Hx    Allergies  Allergen Reactions  . Amoxicillin Hives  . Banana Other (See Comments)    Reaction: unknown  . Penicillins Hives    Has patient had a PCN reaction causing immediate rash, facial/tongue/throat swelling, SOB or lightheadedness with hypotension: Yes Has patient had a PCN reaction causing severe rash involving mucus membranes or skin necrosis: No Has patient had a PCN reaction that required hospitalization No Has patient had a PCN reaction occurring within the last 10 years: No If all of the above answers are NO, then may proceed with Cephalosporin use.     Patient Care Team: Naleyah Ohlinger, PA-C as PCP - General (Physician Assistant)   Medications: Outpatient Medications Prior to Visit  Medication Sig  . cariprazine  (VRAYLAR ) 3 MG capsule Take 1 capsule (3 mg total) by  mouth daily. Take one tablet once a day  . Cholecalciferol (VITAMIN D3) 1.25 MG (50000 UT) CAPS Take 1 capsule by mouth once a week.  . lamoTRIgine  (LAMICTAL ) 100 MG tablet Take 1 tablet (100 mg total) by mouth daily.  Taking 1 once a day  . pantoprazole  (PROTONIX ) 40 MG tablet Take 1 tablet (40 mg total) by mouth 2 (two) times daily.  SABRA SLOW RELEASE IRON 45 MG TBCR 1 tablet daily.  . traZODone  (DESYREL ) 50 MG tablet Take 1 tablet (50 mg total) by mouth at bedtime.   No facility-administered medications prior to visit.    Review of Systems  All other systems reviewed and are negative.  Except see HPI  {Insert previous labs (optional):23779} {See past labs  Heme  Chem  Endocrine  Serology  Results Review (optional):1}  Objective    There were no vitals taken for this visit. {Insert last BP/Wt (optional):23777}{See vitals history (optional):1}    Physical Exam Vitals reviewed.  Constitutional:      General: She is not in acute distress.    Appearance: Normal appearance. She is well-developed. She is not ill-appearing, toxic-appearing or diaphoretic.  HENT:     Head: Normocephalic and atraumatic.     Right Ear: Tympanic membrane, ear canal and external ear normal.     Left Ear: Tympanic membrane, ear canal and external ear normal.     Nose: Nose normal. No congestion or rhinorrhea.     Mouth/Throat:     Mouth: Mucous membranes are moist.     Pharynx: Oropharynx is clear. No oropharyngeal exudate.  Eyes:     General: No scleral icterus.       Right eye: No discharge.        Left eye: No discharge.     Conjunctiva/sclera: Conjunctivae normal.     Pupils: Pupils are equal, round, and reactive to light.  Neck:     Thyroid : No thyromegaly.     Vascular: No carotid bruit.  Cardiovascular:     Rate and Rhythm: Normal rate and regular rhythm.     Pulses: Normal pulses.     Heart sounds: Normal heart sounds. No murmur heard.    No friction rub. No gallop.  Pulmonary:      Effort: Pulmonary effort is normal. No respiratory distress.     Breath sounds: Normal breath sounds. No wheezing or rales.  Abdominal:     General: Abdomen is flat. Bowel sounds are normal. There is no distension.     Palpations: Abdomen is soft. There is no mass.     Tenderness: There is no abdominal tenderness. There is  no right CVA tenderness, left CVA tenderness, guarding or rebound.     Hernia: No hernia is present.  Musculoskeletal:        General: No swelling, tenderness, deformity or signs of injury. Normal range of motion.     Cervical back: Normal range of motion and neck supple. No rigidity or tenderness.     Right lower leg: No edema.     Left lower leg: No edema.  Lymphadenopathy:     Cervical: No cervical adenopathy.  Skin:    General: Skin is warm and dry.     Coloration: Skin is not jaundiced or pale.     Findings: No bruising, erythema, lesion or rash.  Neurological:     Mental Status: She is alert and oriented to person, place, and time. Mental status is at baseline.     Gait: Gait normal.  Psychiatric:        Mood and Affect: Mood normal.        Behavior: Behavior normal.        Thought Content: Thought content normal.        Judgment: Judgment normal.     No results found for any visits on 02/15/24.  Assessment & Plan    Routine Health Maintenance and Physical Exam  Exercise Activities and Dietary recommendations  Goals     . Quit Smoking        Immunization History  Administered Date(s) Administered  . Dtap, 5 Pertussis Antigens 10/25/2010  . Tdap 11/22/2010, 09/26/2022    Health Maintenance  Topic Date Due  . Pneumococcal Vaccine (1 of 2 - PCV) Never done  . Hepatitis B Vaccine (1 of 3 - 19+ 3-dose series) Never done  . HPV Vaccine (1 - 3-dose SCDM series) Never done  . Pap with HPV screening  Never done  . Breast Cancer Screening  05/12/2021  . Colon Cancer Screening  Never done  . Flu Shot  Never done  . COVID-19 Vaccine (1 - 2025-26  season) Never done  . DTaP/Tdap/Td vaccine (3 - Td or Tdap) 09/25/2032  . Hepatitis C Screening  Completed  . HIV Screening  Completed  . Meningitis B Vaccine  Aged Out    Discussed health benefits of physical activity, and encouraged her to engage in regular exercise appropriate for her age and condition.  Assessment and Plan Assessment & Plan      ***  No follow-ups on file.    The patient was advised to call back or seek an in-person evaluation if the symptoms worsen or if the condition fails to improve as anticipated.  I discussed the assessment and treatment plan with the patient. The patient was provided an opportunity to ask questions and all were answered. The patient agreed with the plan and demonstrated an understanding of the instructions.  I, Kaziah Krizek, PA-C have reviewed all documentation for this visit. The documentation on 02/15/2024  for the exam, diagnosis, procedures, and orders are all accurate and complete.  Jolynn Spencer, [facility], MMS [facility] [phone] (phone) [phone] (fax)  [facility]

## 2024-02-15 ENCOUNTER — Encounter: Admitting: Physician Assistant

## 2024-02-15 DIAGNOSIS — Z0000 Encounter for general adult medical examination without abnormal findings: Secondary | ICD-10-CM

## 2024-02-26 ENCOUNTER — Telehealth: Admitting: Psychiatry

## 2024-02-26 DIAGNOSIS — F325 Major depressive disorder, single episode, in full remission: Secondary | ICD-10-CM | POA: Diagnosis not present

## 2024-02-26 DIAGNOSIS — F411 Generalized anxiety disorder: Secondary | ICD-10-CM

## 2024-02-26 MED ORDER — CARIPRAZINE HCL 3 MG PO CAPS: 3.0000 mg | ORAL_CAPSULE | Freq: Every day | ORAL | 1 refills | Status: DC

## 2024-02-26 MED ORDER — LAMOTRIGINE 100 MG PO TABS: 100.0000 mg | ORAL_TABLET | Freq: Every day | ORAL | 1 refills | Status: AC

## 2024-02-26 MED ORDER — TRAZODONE HCL 50 MG PO TABS: 50.0000 mg | ORAL_TABLET | Freq: Every day | ORAL | 1 refills | Status: DC

## 2024-02-26 NOTE — Progress Notes (Signed)
 [facility] MD/PA/NP OP Progress Note  02/26/2024 9:01 AM Kayla Blanchard  MRN:  [id number]  Chief Complaint: Routine Complete Follow-up Virtual Visit via Video Note  I connected with Kayla Blanchard on 02/26/2024 at  9:00 AM EST by a video enabled telemedicine application and verified that I am speaking with the correct person using two identifiers.  Location: Patient: [address]  [place] [address]  Provider: [facility]    I discussed the limitations of evaluation and management by telemedicine and the availability of in person appointments. The patient expressed understanding and agreed to proceed.    I discussed the assessment and treatment plan with the patient. The patient was provided an opportunity to ask questions and all were answered. The patient agreed with the plan and demonstrated an understanding of the instructions.   The patient was advised to call back or seek an in-person evaluation if the symptoms worsen or if the condition fails to improve as anticipated.  I provided 30 minutes of non-face-to-face time during this encounter.   Dorn Jama Der, NP   HPI: 43 year old female presenting [facility] for follow-up.  Patient reports that all the medication is going well stating that she has had no issues stating it is really cool today at her work and states that they have taken a couple Snow days and she has been enjoying the break.  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  Patient reports that her bipolar disorder is well-managed and states the current medication regimen is managing her very well.  Patient also states that she is driving that has relieve a lot of stress while we initially for started the sessions.  Patient reports that she does not need any medication adjustments at this time.  Patient reports she is very satisfied and would like to follow this provider to the next practice.  Based on  this assessment interview patient will continue current medication regimen, prescriptions refill sent, see plan.  Patient with no other question concerns at this time.  Patient is in agreement treatment plan.  Based on this patient wanting to follow this provider to next practice patient with no follow-up.  Patient denies SI, HI, AVH. Visit Diagnosis:    ICD-10-CM   1. 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 Generalized anxiety disorder  F41.1       Past Psychiatric History:  Previous Psych Hospitalizations: - Denies   Outpatient treatment:  - Current PCP is Clarita Spencer PA-C   Medications Current: - Vraylar  3 mg once daily -Lamictal  100 mg once daily - Trazodone  50 mg once daily before bed Next Steps: - Patient currently on maintenance Medication Trials: - Denies Suicide & Violence: - Denies SI, HI, AVH Substance Use: - Denies Psychotherapy: - Not recommended at this time Legal:  - Denies  Past Medical History:  Past Medical History:  Diagnosis Date   COPD (chronic obstructive pulmonary disease) (HCC)    Gastric ulcer    History of kidney infection    MI (myocardial infarction) (HCC)     Past Surgical History:  Procedure Laterality Date   ESOPHAGOGASTRODUODENOSCOPY (EGD) WITH PROPOFOL  N/A 11/21/2018   Procedure: ESOPHAGOGASTRODUODENOSCOPY (EGD) WITH PROPOFOL ;  Surgeon: Jinny Carmine, MD;  Location: [facility] ENDOSCOPY;  Service: Endoscopy;  Laterality: N/A;   NO PAST SURGERIES      Family Psychiatric History: No additional  Family History:  Family History  Problem Relation Age of Onset   Breast cancer Neg Hx     Social History:  Social History   Socioeconomic History   Marital status: Single    Spouse name: Not on file   Number of children: 2   Years of education: Not on file   Highest education level: Some college, no degree  Occupational History   Not on file  Tobacco Use   Smoking status: Every Day    Current packs/day: 0.50    Types:  Cigarettes   Smokeless tobacco: Never  Vaping Use   Vaping status: Never Used  Substance and Sexual Activity   Alcohol use: Yes    Alcohol/week: 14.0 standard drinks of alcohol    Types: 14 Glasses of wine per week    Comment: 2 glasses of wine daily   Drug use: No   Sexual activity: Yes  Other Topics Concern   Not on file  Social History Narrative   Not on file   Social Drivers of Health   Tobacco Use: High Risk (05/22/2023)   Patient History    Smoking Tobacco Use: Every Day    Smokeless Tobacco Use: Never    Passive Exposure: Not on file  Financial Resource Strain: Not on file  Food Insecurity: Not on file  Transportation Needs: Not on file  Physical Activity: Not on file  Stress: Not on file  Social Connections: Not on file  Depression (PHQ2-9): Low Risk (08/22/2023)   Depression (PHQ2-9)    PHQ-2 Score: 0  Alcohol Screen: Not on file  Housing: Not on file  Utilities: Not on file  Health Literacy: Not on file    Allergies: Allergies[1]  Metabolic Disorder Labs: No results found for: HGBA1C, MPG No results found for: PROLACTIN Lab Results  Component Value Date   CHOL 272 (H) 10/25/2022   TRIG 191 (H) 10/25/2022   HDL 87 10/25/2022   CHOLHDL 3.1 10/25/2022   LDLCALC 151 (H) 10/25/2022   Lab Results  Component Value Date   TSH 1.100 10/25/2022    Therapeutic Level Labs: No results found for: LITHIUM No results found for: VALPROATE No results found for: CBMZ  Current Medications: Current Outpatient Medications  Medication Sig Dispense Refill   cariprazine  (VRAYLAR ) 3 MG capsule Take 1 capsule (3 mg total) by mouth daily. Take one tablet once a day 90 capsule 1   Cholecalciferol (VITAMIN D3) 1.25 MG (50000 UT) CAPS Take 1 capsule by mouth once a week.     lamoTRIgine  (LAMICTAL ) 100 MG tablet Take 1 tablet (100 mg total) by mouth daily.  Taking 1 once a day 90 tablet 1   pantoprazole  (PROTONIX ) 40 MG tablet Take 1 tablet (40 mg total) by  mouth 2 (two) times daily. 60 tablet 2   SLOW RELEASE IRON 45 MG TBCR 1 tablet daily.     traZODone  (DESYREL ) 50 MG tablet Take 1 tablet (50 mg total) by mouth at bedtime. 90 tablet 1   No current facility-administered medications for this visit.     Musculoskeletal: Strength & Muscle Tone: within normal limits Gait & Station: normal Patient leans: N/A   Psychiatric Specialty Exam: Review of Systems  Constitutional: Negative.   HENT: Negative.    Eyes: Negative.   Respiratory: Negative.    Cardiovascular: Negative.   Gastrointestinal: Negative.   Endocrine: Negative.   Genitourinary: Negative.   Musculoskeletal: Negative.   Skin: Negative.   Allergic/Immunologic: Negative.   Neurological: Negative.   Hematological: Negative.   Psychiatric/Behavioral:  Negative.       There were no vitals taken for this visit.There is no height or weight on file to calculate BMI.  General Appearance: Well Groomed  Eye Contact:  Good  Speech:  Clear and Coherent  Volume:  Normal  Mood:  Euthymic  Affect:  Appropriate  Thought Process:  Coherent  Orientation:  Full (Time, Place, and Person)  Thought Content: Logical   Suicidal Thoughts:  No  Homicidal Thoughts:  No  Memory:  Immediate;   Good Recent;   Good Remote;   Good  Judgement:  Good  Insight:  Good  Psychomotor Activity:  Normal  Concentration:  Concentration: Good and Attention Span: Good  Recall:  Good  Fund of Knowledge: Good  Language: Good  Akathisia:  No  Handed:  Right  AIMS (if indicated):   Assets:  Desire for Improvement Financial Resources/Insurance Housing  ADL's:  Intact  Cognition: WNL  Sleep:  Good   Screenings: GAD-7    Flowsheet Row Video Visit from 08/22/2023 in [facility] Office Visit from 01/03/2023 in [facility]  Total GAD-7 Score 0 0   PHQ2-9    Flowsheet Row Video Visit from 08/22/2023 in [facility] Office Visit from 05/22/2023 in [facility] Office Visit from 01/03/2023 in [facility]  PHQ-2 Total Score 0 1 0  PHQ-9 Total Score -- -- 0   Flowsheet Row Video Visit from 08/22/2023 in [facility] Office Visit from 05/22/2023 in [facility]  C-SSRS RISK CATEGORY No Risk No Risk     Assessment and Plan:  Assessment - Diagnosis:  Depression, major, in remission (HCC) [F32.5]  Generalized anxiety disorder [F41.1]    Differential: Bipolar Disorder   - Progress: Baseline assessment completed today.  - Risk Factors: No risk factors identified at this time, patient denies alcohol use and reports no recent relapse, stating sober since 2010.  Also denies firearms in the home.  Denies SI, HI.   Plan - Medications:  Continue Vraylar  3 mg p.o. once daily continued from previous prescription, patient reports stability and satisfaction and dosage Continue Lamictal  100 mg p.o. once daily continued from previous prescription, patient reports stability and satisfaction with dosage Continue Trazodone  50 mg p.o. once daily at night continue from previous prescription, patient reports stability and satisfaction with dosage.  Patient also advised to notify provider if patient is taking more than prescribed amount. - Labs: CMP, triglycerides ordered for Vraylar  for monitoring. Pt encouraged to have labs redrawn from PCP.   - Psychotherapy: No psychotherapy recommended at this time - Education: Patient was educated on the presentations and asked symptoms to monitor for depression and anxiety.  Patient was notified to reach out to provider on MyChart should the patient have symptoms.  Patient was also educated to get labs to monitor metabolic labs as well as triglycerides due to taking Vraylar  - Follow-Up: Patient will follow every 3  months for medication monitoring, patient was also educated to reach out should patient experience any symptoms before next visit. - Referrals: No referrals needed at this time - Safety Planning: Patient currently states no SI, HI, patient also endorses that there are no firearms in the home  Patient/Guardian was advised Release of Information must be obtained prior to any record release in order to collaborate their care with an outside provider. Patient/Guardian was advised  if they have not already done so to contact the registration department to sign all necessary forms in order for us  to release information regarding their care.   Consent: Patient/Guardian gives verbal consent for treatment and assignment of benefits for services provided during this visit. Patient/Guardian expressed understanding and agreed to proceed.    Dorn Jama Der, NP 02/26/2024, 9:01 AM      [1]  Allergies Allergen Reactions   Amoxicillin Hives   Banana Other (See Comments)    Reaction: unknown   Penicillins Hives    Has patient had a PCN reaction causing immediate rash, facial/tongue/throat swelling, SOB or lightheadedness with hypotension: Yes Has patient had a PCN reaction causing severe rash involving mucus membranes or skin necrosis: No Has patient had a PCN reaction that required hospitalization No Has patient had a PCN reaction occurring within the last 10 years: No If all of the above answers are NO, then may proceed with Cephalosporin use.

## 2024-03-20 ENCOUNTER — Other Ambulatory Visit: Payer: Self-pay | Admitting: Psychiatry

## 2024-03-20 MED ORDER — CARIPRAZINE HCL 3 MG PO CAPS: 3.0000 mg | ORAL_CAPSULE | Freq: Every day | ORAL | 1 refills | Status: AC

## 2024-03-20 MED ORDER — TRAZODONE HCL 50 MG PO TABS: 50.0000 mg | ORAL_TABLET | Freq: Every day | ORAL | 1 refills | Status: AC

## 2024-04-04 ENCOUNTER — Telehealth: Payer: Self-pay | Admitting: Physician Assistant

## 2024-04-04 ENCOUNTER — Ambulatory Visit: Admitting: Physician Assistant

## 2024-04-04 ENCOUNTER — Other Ambulatory Visit (HOSPITAL_COMMUNITY)
Admission: RE | Admit: 2024-04-04 | Discharge: 2024-04-04 | Disposition: A | Source: Ambulatory Visit | Attending: Physician Assistant | Admitting: Physician Assistant

## 2024-04-04 VITALS — BP 133/85 | HR 92 | Ht 62.0 in | Wt 115.0 lb

## 2024-04-04 DIAGNOSIS — Z124 Encounter for screening for malignant neoplasm of cervix: Secondary | ICD-10-CM | POA: Diagnosis present

## 2024-04-04 DIAGNOSIS — Z0184 Encounter for antibody response examination: Secondary | ICD-10-CM | POA: Diagnosis present

## 2024-04-04 DIAGNOSIS — L819 Disorder of pigmentation, unspecified: Secondary | ICD-10-CM | POA: Diagnosis not present

## 2024-04-04 DIAGNOSIS — Z0000 Encounter for general adult medical examination without abnormal findings: Secondary | ICD-10-CM | POA: Insufficient documentation

## 2024-04-04 DIAGNOSIS — Z1211 Encounter for screening for malignant neoplasm of colon: Secondary | ICD-10-CM | POA: Insufficient documentation

## 2024-04-04 DIAGNOSIS — N898 Other specified noninflammatory disorders of vagina: Secondary | ICD-10-CM | POA: Diagnosis present

## 2024-04-04 DIAGNOSIS — Z0001 Encounter for general adult medical examination with abnormal findings: Secondary | ICD-10-CM

## 2024-04-04 DIAGNOSIS — K219 Gastro-esophageal reflux disease without esophagitis: Secondary | ICD-10-CM | POA: Diagnosis not present

## 2024-04-04 DIAGNOSIS — J449 Chronic obstructive pulmonary disease, unspecified: Secondary | ICD-10-CM

## 2024-04-04 DIAGNOSIS — Z1231 Encounter for screening mammogram for malignant neoplasm of breast: Secondary | ICD-10-CM

## 2024-04-04 MED ORDER — PANTOPRAZOLE SODIUM 40 MG PO TBEC: 40.0000 mg | DELAYED_RELEASE_TABLET | Freq: Two times a day (BID) | ORAL | 2 refills | Status: DC

## 2024-04-04 NOTE — Progress Notes (Signed)
 "    Complete physical exam  Patient: Kayla Blanchard   DOB: [birth year]   44 y.o. Female  MRN: [id number] Visit Date: 04/04/2024  Today's healthcare provider: Jolynn Spencer, PA-C   Chief Complaint  Patient presents with   Annual Exam    Last Exam Physical: 10/25/2022 Diet: healthy Exercise: keeps active Feeling: Fairly Well Sleeping: Poorly-even on trazodone , gets 7 hours of sleep but keeps waking up. No concerns    Medical Management of Chronic Issues    Need refill on Pantoprazole .   Subjective    Kayla Blanchard is a 44 y.o. female who presents today for a complete physical exam.   HPI     Annual Exam    Additional comments: Last Exam Physical: 10/25/2022 Diet: healthy Exercise: keeps active Feeling: Fairly Well Sleeping: Poorly-even on trazodone , gets 7 hours of sleep but keeps waking up. No concerns         Medical Management of Chronic Issues    Additional comments: Need refill on Pantoprazole .      Last edited by Rosas, Joseline E, CMA on 04/04/2024  3:07 PM.      Discussed the use of AI scribe software for clinical note transcription with the patient, who gave verbal consent to proceed.  History of Present Illness Kayla Blanchard is a 44 year old female who presents for a routine physical exam and medication refill for acid reflux.  She has been off Protonix  for about six months due to lack of insurance and is requesting a refill now that she has coverage. Since stopping it she has had recurrent vomiting, which she relates to being off the medication.  Her menstrual periods are very light, mostly noted when she urinates, lasting about three days, and this pattern has been stable for two to three years. She only needs pads or tampons on the first day.  She quit smoking one year ago after smoking since childhood. She now has a persistent cough and occasional wheezing without recent illness. She does not use inhalers and is concerned about their  safety.  She drinks a small amount of wine in the evening to help with sleep.  She has not had a Pap smear in over a year and reports no prior abnormal results.  She has a sensation of muffiness in her ears. She denies bowel changes, abdominal pain, cold or heat intolerance, or a throat lump.    Last depression screening scores    04/04/2024    3:03 PM 08/22/2023    9:56 AM 05/22/2023   10:44 AM  PHQ 2/9 Scores  PHQ - 2 Score 2    PHQ- 9 Score 8       Information is confidential and restricted. Go to Review Flowsheets to unlock data.   Last fall risk screening    04/04/2024    3:03 PM  Fall Risk   Falls in the past year? 0  Number falls in past yr: 0  Injury with Fall? 0  Risk for fall due to : No Fall Risks  Follow up Falls evaluation completed   Last Audit-C alcohol use screening     No data to display         A score of 3 or more in women, and 4 or more in men indicates increased risk for alcohol abuse, EXCEPT if all of the points are from question 1   Past Medical History:  Diagnosis Date   COPD (chronic obstructive pulmonary disease) (HCC)  Gastric ulcer    History of kidney infection    MI (myocardial infarction) [facility])    Past Surgical History:  Procedure Laterality Date   ESOPHAGOGASTRODUODENOSCOPY (EGD) WITH PROPOFOL  N/A 11/21/2018   Procedure: ESOPHAGOGASTRODUODENOSCOPY (EGD) WITH PROPOFOL ;  Surgeon: Jinny Carmine, MD;  Location: [facility] ENDOSCOPY;  Service: Endoscopy;  Laterality: N/A;   NO PAST SURGERIES     Social History   Socioeconomic History   Marital status: Single    Spouse name: Not on file   Number of children: 2   Years of education: Not on file   Highest education level: Some college, no degree  Occupational History   Not on file  Tobacco Use   Smoking status: Every Day    Current packs/day: 0.50    Types: Cigarettes   Smokeless tobacco: Never  Vaping Use   Vaping status: Never Used  Substance and Sexual Activity   Alcohol use:  Yes    Alcohol/week: 14.0 standard drinks of alcohol    Types: 14 Glasses of wine per week    Comment: 2 glasses of wine daily   Drug use: No   Sexual activity: Yes  Other Topics Concern   Not on file  Social History Narrative   Not on file   Social Drivers of Health   Tobacco Use: High Risk (05/22/2023)   Patient History    Smoking Tobacco Use: Every Day    Smokeless Tobacco Use: Never    Passive Exposure: Not on file  Financial Resource Strain: Not on file  Food Insecurity: Not on file  Transportation Needs: Not on file  Physical Activity: Not on file  Stress: Not on file  Social Connections: Not on file  Intimate Partner Violence: Not on file  Depression (PHQ2-9): Medium Risk (04/04/2024)   Depression (PHQ2-9)    PHQ-2 Score: 8  Alcohol Screen: Not on file  Housing: Not on file  Utilities: Not on file  Health Literacy: Not on file   Family Status  Relation Name Status   Mother  Deceased   Father  Alive   Brother  Alive   H Sister  Alive   Neg Hx  (Not Specified)  No partnership data on file   Family History  Problem Relation Age of Onset   Breast cancer Neg Hx    Allergies[1]  Patient Care Team: Sueanne Maniaci, PA-C as PCP - General (Physician Assistant)   Medications: Show/hide medication list[2]  Review of Systems  All other systems reviewed and are negative.  Except see HPI     Objective    BP 133/85 (BP Location: Right Arm, Patient Position: Sitting, Cuff Size: Normal)   Pulse 92   Ht 5' 2 (1.575 m)   Wt 115 lb (52.2 kg)   LMP 03/14/2024 (Exact Date)   SpO2 97%   BMI 21.03 kg/m      Physical Exam Vitals reviewed.  Constitutional:      General: She is not in acute distress.    Appearance: Normal appearance. She is well-developed. She is not ill-appearing, toxic-appearing or diaphoretic.  HENT:     Head: Normocephalic and atraumatic.     Right Ear: Tympanic membrane, ear canal and external ear normal.     Left Ear: Tympanic  membrane, ear canal and external ear normal.     Nose: Nose normal. No congestion or rhinorrhea.     Mouth/Throat:     Mouth: Mucous membranes are moist.     Pharynx: Oropharynx is clear. No oropharyngeal  exudate or posterior oropharyngeal erythema.  Eyes:     General: No scleral icterus.       Right eye: No discharge.        Left eye: No discharge.     Conjunctiva/sclera: Conjunctivae normal.     Pupils: Pupils are equal, round, and reactive to light.  Neck:     Thyroid : No thyromegaly.     Vascular: No carotid bruit.  Cardiovascular:     Rate and Rhythm: Normal rate and regular rhythm.     Pulses: Normal pulses.     Heart sounds: Normal heart sounds. No murmur heard.    No friction rub. No gallop.  Pulmonary:     Effort: Pulmonary effort is normal. No respiratory distress.     Breath sounds: Normal breath sounds. No wheezing or rales.  Chest:     Chest wall: No mass, lacerations, deformity, swelling, tenderness, crepitus or edema. There is no dullness to percussion.  Breasts:    Tanner Score is 5.     Right: Normal. No swelling, bleeding, inverted nipple, mass, nipple discharge, skin change or tenderness.     Left: Normal. No swelling, bleeding, inverted nipple, mass, nipple discharge, skin change or tenderness.  Abdominal:     General: Abdomen is flat. Bowel sounds are normal. There is no distension.     Palpations: Abdomen is soft. There is no mass.     Tenderness: There is no abdominal tenderness. There is no right CVA tenderness, left CVA tenderness, guarding or rebound.     Hernia: No hernia is present. There is no hernia in the left inguinal area or right inguinal area.  Genitourinary:    Exam position: Lithotomy position.     Pubic Area: No rash or pubic lice.      Tanner stage (genital): 5.     Labia:        Right: No rash, tenderness, lesion or injury.        Left: No rash, tenderness, lesion or injury.      Urethra: No prolapse, urethral pain, urethral swelling or  urethral lesion.     Vagina: Normal.     Cervix: Normal.     Uterus: Normal.      Adnexa: Right adnexa normal and left adnexa normal.     Rectum: Normal.  Musculoskeletal:        General: No swelling, tenderness, deformity or signs of injury. Normal range of motion.     Cervical back: Normal range of motion and neck supple. No rigidity or tenderness.     Right lower leg: No edema.     Left lower leg: No edema.  Lymphadenopathy:     Cervical: No cervical adenopathy.     Upper Body:     Right upper body: No supraclavicular or axillary adenopathy.     Left upper body: No supraclavicular or axillary adenopathy.     Lower Body: No right inguinal adenopathy. No left inguinal adenopathy.  Skin:    General: Skin is warm and dry.     Coloration: Skin is not jaundiced or pale.     Findings: No bruising, erythema, lesion or rash.  Neurological:     Mental Status: She is alert and oriented to person, place, and time. Mental status is at baseline.     Gait: Gait normal.  Psychiatric:        Mood and Affect: Mood normal.        Behavior: Behavior normal.  Thought Content: Thought content normal.        Judgment: Judgment normal.     Chaperone was present during the pelvic and breast exams  No results found for any visits on 04/04/24.  Assessment & Plan    Routine Health Maintenance and Physical Exam  Exercise Activities and Dietary recommendations  Goals      Quit Smoking        Immunization History  Administered Date(s) Administered   Dtap, 5 Pertussis Antigens 10/25/2010   Tdap 11/22/2010, 09/26/2022    Health Maintenance  Topic Date Due   Pneumococcal Vaccine (1 of 2 - PCV) Never done   Hepatitis B Vaccine (1 of 3 - 19+ 3-dose series) Never done   Pap with HPV screening  Never done   Breast Cancer Screening  05/12/2021   Colon Cancer Screening  Never done   COVID-19 Vaccine (1 - 2025-26 season) Never done   Flu Shot  06/11/2024*   DTaP/Tdap/Td vaccine (3 - Td  or Tdap) 09/25/2032   HPV Vaccine (No Doses Required) Completed   Hepatitis C Screening  Completed   HIV Screening  Completed   Meningitis B Vaccine  Aged Out  *Topic was postponed. The date shown is not the original due date.    Discussed health benefits of physical activity, and encouraged her to engage in regular exercise appropriate for her age and condition.  Assessment and Plan Assessment & Plan Breast cancer screening Due for routine screening. - Performed breast exam. - Ordered mammogram.  Cervical cancer screening Due for routine screening. No history of positive Pap smears. - ordered pap smear Will follow-up  Chronic obstructive pulmonary disease COPD with wheezing and cough. Discussed risks of exacerbation and respiratory failure without treatment. - Prescribed albuterol  rescue inhaler. - Prescribed maintenance inhaler. - Recommended follow-up with pulmonology. - Administered pneumonia vaccination.  Gastroesophageal reflux disease Chronic GERD previously managed with pantoprazole . Off medication for six months due to insurance issues. Discussed over-the-counter options and potential side effects of high-dose pantoprazole . - Prescribed pantoprazole  40 mg with instructions to start with half tablet. - Encouraged use of over-the-counter pantoprazole  20 mg as an alternative.  Pigmented groin lesion Pigmented lesion on the groin area noted during examination. - Referred to dermatology for evaluation of pigmented groin lesion if pt agrees.  Encounter for screening mammogram for malignant neoplasm of breast  - MM 3D SCREENING MAMMOGRAM BILATERAL BREAST - Cervicovaginal ancillary only  Encounter for screening for cervical cancer Vaginal discharge - Cytology - PAP - Cervicovaginal ancillary only  Annual physical exam (Primary) Adult exam with abnormal findings Things to do to keep yourself healthy  - Exercise at least 30-45 minutes a day, 3-4 days a week.  - Eat  a low-fat diet with lots of fruits and vegetables, up to 7-9 servings per day.  - Seatbelts can save your life. Wear them always.  - Smoke detectors on every level of your home, check batteries every year.  - Eye Doctor - have an eye exam every 1-2 years  - Safe sex - if you may be exposed to STDs, use a condom.  - Alcohol -  If you drink, do it moderately, less than 2 drinks per day.  - Health Care Power of Attorney. Choose someone to speak for you if you are not able.  - Depression is common in our stressful world.If you're feeling down or losing interest in things you normally enjoy, please come in for a visit.  - Violence -  If anyone is threatening or hurting you, please call immediately.  - Comprehensive metabolic panel with GFR - CBC with Differential/Platelet - Lipid panel - Cervicovaginal ancillary only   Encounter for screening for cervical cancer  - Cytology - PAP  Annual physical exam (Primary)  - Comprehensive metabolic panel with GFR - CBC with Differential/Platelet - Lipid panel - Cervicovaginal ancillary only  Screen for colon cancer  - Ambulatory referral to Gastroenterology - CBC with Differential/Platelet - Lipid panel  Immunity to hepatitis B virus demonstrated by serologic test  - Hepatitis B surface antibody,quantitative   Chronic obstructive pulmonary disease, unspecified COPD type (HCC)  - CBC with Differential/Platelet - Lipid panel - albuterol  (VENTOLIN  HFA) 108 (90 Base) MCG/ACT inhaler; Inhale 2 puffs into the lungs every 6 (six) hours as needed for wheezing or shortness of breath.  Dispense: 8 g; Refill: 0 - fluticasone -salmeterol (ADVAIR) 100-50 MCG/ACT AEPB; Inhale 1 puff into the lungs 2 (two) times daily.  Dispense: 1 each; Refill: 3  Pigmented skin lesion  - Ambulatory referral to Dermatology  No follow-ups on file.    The patient was advised to call back or seek an in-person evaluation if the symptoms worsen or if the condition  fails to improve as anticipated.  I discussed the assessment and treatment plan with the patient. The patient was provided an opportunity to ask questions and all were answered. The patient agreed with the plan and demonstrated an understanding of the instructions.  I, Alaiah Lundy, PA-C have reviewed all documentation for this visit. The documentation on 04/04/2024  for the exam, diagnosis, procedures, and orders are all accurate and complete.  Jolynn Spencer, [facility], MMS [facility] [phone] (phone) [phone] (fax)  [facility]     [1]  Allergies Allergen Reactions   Amoxicillin Hives   Banana Other (See Comments)    Reaction: unknown   Penicillins Hives    Has patient had a PCN reaction causing immediate rash, facial/tongue/throat swelling, SOB or lightheadedness with hypotension: Yes Has patient had a PCN reaction causing severe rash involving mucus membranes or skin necrosis: No Has patient had a PCN reaction that required hospitalization No Has patient had a PCN reaction occurring within the last 10 years: No If all of the above answers are NO, then may proceed with Cephalosporin use.   [2]  Outpatient Medications Prior to Visit  Medication Sig   ARIPiprazole (ABILIFY) 5 MG tablet Take 5 mg by mouth daily.   lamoTRIgine  (LAMICTAL ) 100 MG tablet Take 1 tablet (100 mg total) by mouth daily.  Taking 1 once a day   pantoprazole  (PROTONIX ) 40 MG tablet Take 1 tablet (40 mg total) by mouth 2 (two) times daily.   traZODone  (DESYREL ) 50 MG tablet Take 1 tablet (50 mg total) by mouth at bedtime.   cariprazine  (VRAYLAR ) 3 MG capsule Take 1 capsule (3 mg total) by mouth daily. Take one tablet once a day   Cholecalciferol (VITAMIN D3) 1.25 MG (50000 UT) CAPS Take 1 capsule by mouth once a week.   SLOW RELEASE IRON 45 MG TBCR 1 tablet daily.   No facility-administered medications prior to visit.   "

## 2024-04-04 NOTE — Telephone Encounter (Signed)
[facility] faxed refill request for the following medications:  pantoprazole (PROTONIX) 40 MG tablet   Please advise.  

## 2024-04-05 LAB — CBC WITH DIFFERENTIAL/PLATELET
Basophils Absolute: 0.1 x10E3/uL (ref 0.0–0.2)
Basos: 1 %
EOS (ABSOLUTE): 0.1 x10E3/uL (ref 0.0–0.4)
Eos: 1 %
Hematocrit: 46.2 % (ref 34.0–46.6)
Hemoglobin: 15.8 g/dL (ref 11.1–15.9)
Immature Grans (Abs): 0.1 x10E3/uL (ref 0.0–0.1)
Immature Granulocytes: 1 %
Lymphocytes Absolute: 3.1 x10E3/uL (ref 0.7–3.1)
Lymphs: 34 %
MCH: 35.3 pg — ABNORMAL HIGH (ref 26.6–33.0)
MCHC: 34.2 g/dL (ref 31.5–35.7)
MCV: 103 fL — ABNORMAL HIGH (ref 79–97)
Monocytes Absolute: 0.6 x10E3/uL (ref 0.1–0.9)
Monocytes: 7 %
Neutrophils Absolute: 5 x10E3/uL (ref 1.4–7.0)
Neutrophils: 56 %
Platelets: 372 x10E3/uL (ref 150–450)
RBC: 4.48 x10E6/uL (ref 3.77–5.28)
RDW: 12 % (ref 11.7–15.4)
WBC: 9 x10E3/uL (ref 3.4–10.8)

## 2024-04-05 LAB — COMPREHENSIVE METABOLIC PANEL WITH GFR
ALT: 11 IU/L (ref 0–32)
AST: 17 IU/L (ref 0–40)
Albumin: 4.8 g/dL (ref 3.9–4.9)
Alkaline Phosphatase: 64 IU/L (ref 41–116)
BUN/Creatinine Ratio: 17 (ref 9–23)
BUN: 13 mg/dL (ref 6–24)
Bilirubin Total: 0.4 mg/dL (ref 0.0–1.2)
CO2: 21 mmol/L (ref 20–29)
Calcium: 9.6 mg/dL (ref 8.7–10.2)
Chloride: 99 mmol/L (ref 96–106)
Creatinine, Ser: 0.76 mg/dL (ref 0.57–1.00)
Globulin, Total: 2.3 g/dL (ref 1.5–4.5)
Glucose: 85 mg/dL (ref 70–99)
Potassium: 4.6 mmol/L (ref 3.5–5.2)
Sodium: 136 mmol/L (ref 134–144)
Total Protein: 7.1 g/dL (ref 6.0–8.5)
eGFR: 98 mL/min/1.73

## 2024-04-05 LAB — CERVICOVAGINAL ANCILLARY ONLY
Bacterial Vaginitis (gardnerella): POSITIVE — AB
Candida Glabrata: NEGATIVE
Candida Vaginitis: NEGATIVE
Comment: NEGATIVE
Comment: NEGATIVE
Comment: NEGATIVE

## 2024-04-05 LAB — LIPID PANEL
Chol/HDL Ratio: 3 ratio (ref 0.0–4.4)
Cholesterol, Total: 249 mg/dL — ABNORMAL HIGH (ref 100–199)
HDL: 82 mg/dL
LDL Chol Calc (NIH): 147 mg/dL — ABNORMAL HIGH (ref 0–99)
Triglycerides: 116 mg/dL (ref 0–149)
VLDL Cholesterol Cal: 20 mg/dL (ref 5–40)

## 2024-04-05 LAB — HEPATITIS B SURFACE ANTIBODY, QUANTITATIVE: Hepatitis B Surf Ab Quant: <3.5 m[IU]/mL — ABNORMAL LOW

## 2024-04-06 ENCOUNTER — Encounter: Payer: Self-pay | Admitting: Physician Assistant

## 2024-04-06 DIAGNOSIS — N898 Other specified noninflammatory disorders of vagina: Secondary | ICD-10-CM | POA: Insufficient documentation

## 2024-04-06 MED ORDER — FLUTICASONE-SALMETEROL 100-50 MCG/ACT IN AEPB: 1.0000 | INHALATION_SPRAY | Freq: Two times a day (BID) | RESPIRATORY_TRACT | 3 refills | Status: DC

## 2024-04-06 MED ORDER — ALBUTEROL SULFATE HFA 108 (90 BASE) MCG/ACT IN AERS: 2.0000 | INHALATION_SPRAY | Freq: Four times a day (QID) | RESPIRATORY_TRACT | 0 refills | Status: AC | PRN

## 2024-04-07 ENCOUNTER — Other Ambulatory Visit: Payer: Self-pay | Admitting: Physician Assistant

## 2024-04-07 DIAGNOSIS — J449 Chronic obstructive pulmonary disease, unspecified: Secondary | ICD-10-CM

## 2024-04-08 ENCOUNTER — Telehealth: Payer: Self-pay

## 2024-04-08 ENCOUNTER — Other Ambulatory Visit: Payer: Self-pay

## 2024-04-08 DIAGNOSIS — Z1211 Encounter for screening for malignant neoplasm of colon: Secondary | ICD-10-CM

## 2024-04-08 MED ORDER — GOLYTELY 236 G PO SOLR: 4000.0000 mL | Freq: Once | ORAL | 0 refills | Status: AC

## 2024-04-08 NOTE — Telephone Encounter (Signed)
 Gastroenterology Pre-Procedure Review  Request Date: 04/25/24 Requesting Physician: Dr. Theophilus  PATIENT REVIEW QUESTIONS: The patient responded to the following health history questions as indicated:    1. Are you having any GI issues? no 2. Do you have a personal history of Polyps? no 3. Do you have a family history of Colon Cancer or Polyps? no 4. Diabetes Mellitus? no 5. Joint replacements in the past 12 months?no 6. Major health problems in the past 3 months?no 7. Any artificial heart valves, MVP, or defibrillator?no    MEDICATIONS & ALLERGIES:    Patient reports the following regarding taking any anticoagulation/antiplatelet therapy:   Plavix, Coumadin, Eliquis, Xarelto, Lovenox, Pradaxa, Brilinta, or Effient? no Aspirin? no  Patient confirms/reports the following medications:  Current Outpatient Medications  Medication Sig Dispense Refill   polyethylene glycol (GOLYTELY ) 236 g solution Take 4,000 mLs by mouth once for 1 dose. 4000 mL 0   albuterol  (VENTOLIN  HFA) 108 (90 Base) MCG/ACT inhaler Inhale 2 puffs into the lungs every 6 (six) hours as needed for wheezing or shortness of breath. 8 g 0   ARIPiprazole (ABILIFY) 5 MG tablet Take 5 mg by mouth daily.     cariprazine  (VRAYLAR ) 3 MG capsule Take 1 capsule (3 mg total) by mouth daily. Take one tablet once a day 90 capsule 1   fluticasone -salmeterol (ADVAIR) 100-50 MCG/ACT AEPB Inhale 1 puff into the lungs 2 (two) times daily. 1 each 3   lamoTRIgine  (LAMICTAL ) 100 MG tablet Take 1 tablet (100 mg total) by mouth daily.  Taking 1 once a day 90 tablet 1   pantoprazole  (PROTONIX ) 40 MG tablet Take 1 tablet (40 mg total) by mouth 2 (two) times daily. 60 tablet 2   traZODone  (DESYREL ) 50 MG tablet Take 1 tablet (50 mg total) by mouth at bedtime. 90 tablet 1   No current facility-administered medications for this visit.    Patient confirms/reports the following allergies:  Allergies[1]  No orders of the defined types were  placed in this encounter.   AUTHORIZATION INFORMATION Primary Insurance: 1D#: Group #:  Secondary Insurance: 1D#: Group #:  SCHEDULE INFORMATION: Date: 04/25/24 Time: Location: [facility]    [1]  Allergies Allergen Reactions   Amoxicillin Hives   Banana Other (See Comments)    Reaction: unknown   Penicillins Hives    Has patient had a PCN reaction causing immediate rash, facial/tongue/throat swelling, SOB or lightheadedness with hypotension: Yes Has patient had a PCN reaction causing severe rash involving mucus membranes or skin necrosis: No Has patient had a PCN reaction that required hospitalization No Has patient had a PCN reaction occurring within the last 10 years: No If all of the above answers are NO, then may proceed with Cephalosporin use.

## 2024-04-08 NOTE — Telephone Encounter (Signed)
 Requested medication (s) are due for refill today:   Pharmacy comment: Alternative Requested:NOT COVERED.    Requested medication (s) are on the active medication list: yes  Last refill:  04/06/24  Future visit scheduled: {no  Notes to clinic:    Pharmacy comment: Alternative Requested:NOT COVERED.       Requested Prescriptions  Pending Prescriptions Disp Refills   BREYNA 80-4.5 MCG/ACT inhaler [Pharmacy Med Name: BREYNA 80-4.5 MCG INHALER]  0     Pulmonology:  Combination Products Passed - 04/08/2024  4:11 PM      Passed - Valid encounter within last 12 months    Recent Outpatient Visits           4 days ago Annual physical exam   [facility] [place], Janna, PA-C

## 2024-04-09 ENCOUNTER — Ambulatory Visit: Payer: Self-pay | Admitting: Physician Assistant

## 2024-04-09 DIAGNOSIS — B9689 Other specified bacterial agents as the cause of diseases classified elsewhere: Secondary | ICD-10-CM

## 2024-04-09 LAB — CYTOLOGY - PAP
Comment: NEGATIVE
Diagnosis: UNDETERMINED — AB
High risk HPV: NEGATIVE

## 2024-04-09 MED ORDER — METRONIDAZOLE 500 MG PO TABS: 500.0000 mg | ORAL_TABLET | Freq: Two times a day (BID) | ORAL | 0 refills | Status: AC

## 2024-04-10 ENCOUNTER — Other Ambulatory Visit: Payer: Self-pay | Admitting: Physician Assistant

## 2024-04-10 ENCOUNTER — Ambulatory Visit

## 2024-04-10 ENCOUNTER — Other Ambulatory Visit: Payer: Self-pay

## 2024-04-10 ENCOUNTER — Telehealth: Payer: Self-pay | Admitting: Physician Assistant

## 2024-04-10 DIAGNOSIS — K219 Gastro-esophageal reflux disease without esophagitis: Secondary | ICD-10-CM

## 2024-04-10 DIAGNOSIS — R921 Mammographic calcification found on diagnostic imaging of breast: Secondary | ICD-10-CM

## 2024-04-10 DIAGNOSIS — Z23 Encounter for immunization: Secondary | ICD-10-CM | POA: Diagnosis not present

## 2024-04-10 DIAGNOSIS — N6489 Other specified disorders of breast: Secondary | ICD-10-CM

## 2024-04-10 MED ORDER — PANTOPRAZOLE SODIUM 40 MG PO TBEC: 40.0000 mg | DELAYED_RELEASE_TABLET | Freq: Two times a day (BID) | ORAL | 1 refills | Status: DC

## 2024-04-10 NOTE — Telephone Encounter (Signed)
 sent

## 2024-04-10 NOTE — Telephone Encounter (Signed)
 Refill Request / MISSING/ ILLEGIBLE INFORMATION ON RX  [facility]  Medication: pantoprazole  (PROTONIX ) 40 MG tablet  Quantity (if provided): 60  Please RE-send in refill request.

## 2024-04-10 NOTE — Progress Notes (Signed)
" °  Patient was on clinical support to receive her 1st Hep B Vaccination.  Was administered in her left deltoid using 25 g x 1 inch needle.  Patient tolerated vaccine very well.   "

## 2024-04-10 NOTE — Progress Notes (Signed)
 Patient was on clinical support to receive her 1st Hep B Vaccination.  Was administered in her left deltoid using 25 g x 1 inch needle.  Patient tolerated vaccine very well.

## 2024-04-11 NOTE — Telephone Encounter (Signed)
 Per pt's pharmacy, insurance will not cover pantoprazole . A different medication was prescribed.

## 2024-04-12 NOTE — Progress Notes (Signed)
 " Established patient visit  Patient: Kayla Blanchard   DOB: 1980-06-30   44 y.o. Female  MRN: [id number] Visit Date: 04/10/2024  Today's healthcare provider: Ansley Mangiapane, PA-C   Chief Complaint  Patient presents with   Immunizations    Patient was on clinical support to receive her 1st Hep B Vaccination.  Was administered in her left deltoid using 25 g x 1 inch needle.  Patient tolerated vaccine very well.    Subjective     HPI     Immunizations    Additional comments: Patient was on clinical support to receive her 1st Hep B Vaccination.  Was administered in her left deltoid using 25 g x 1 inch needle.  Patient tolerated vaccine very well.       Last edited by Terrel Powell CROME, CMA on 04/10/2024  3:09 PM.       Discussed the use of AI scribe software for clinical note transcription with the patient, who gave verbal consent to proceed.  History of Present Illness        04/04/2024    3:03 PM 08/22/2023    9:56 AM 05/22/2023   10:44 AM  Depression screen PHQ 2/9  Decreased Interest 1    Down, Depressed, Hopeless 1    PHQ - 2 Score 2    Altered sleeping 1    Tired, decreased energy 1    Change in appetite 1    Feeling bad or failure about yourself  1    Trouble concentrating 1    Moving slowly or fidgety/restless 1    Suicidal thoughts 0    PHQ-9 Score 8    Difficult doing work/chores Somewhat difficult       Information is confidential and restricted. Go to Review Flowsheets to unlock data.      04/04/2024    3:04 PM 08/22/2023    9:56 AM 01/03/2023    1:15 PM  GAD 7 : Generalized Anxiety Score  Nervous, Anxious, on Edge 1  0   Control/stop worrying 1  0   Worry too much - different things 1  0   Trouble relaxing 1  0   Restless 1  0   Easily annoyed or irritable 1  0   Afraid - awful might happen 1  0   Total GAD 7 Score 7  0  Anxiety Difficulty Not difficult at all  Not difficult at all     Information is confidential and restricted. Go to Review  Flowsheets to unlock data.   Data saved with a previous flowsheet row definition    Medications: Show/hide medication list[1]  Review of Systems All negative Except see HPI       Objective    LMP 03/14/2024 (Exact Date)     Physical Exam   No results found for any visits on 04/10/24.      Assessment and Plan Assessment & Plan     Orders Placed This Encounter  Procedures   Heplisav-B (HepB-CPG) Vaccine    No follow-ups on file.   The patient was advised to call back or seek an in-person evaluation if the symptoms worsen or if the condition fails to improve as anticipated.  I discussed the assessment and treatment plan with the patient. The patient was provided an opportunity to ask questions and all were answered. The patient agreed with the plan and demonstrated an understanding of the instructions.  I, Brynn Mulgrew, PA-C have reviewed all documentation for this visit. The  documentation on 04/10/2024  for the exam, diagnosis, procedures, and orders are all accurate and complete.  Jolynn Spencer, [facility], MMS [facility] [phone] (phone) [phone] (fax)  [facility]    [1]  Outpatient Medications Prior to Visit  Medication Sig   albuterol  (VENTOLIN  HFA) 108 (90 Base) MCG/ACT inhaler Inhale 2 puffs into the lungs every 6 (six) hours as needed for wheezing or shortness of breath.   ARIPiprazole (ABILIFY) 5 MG tablet Take 5 mg by mouth daily.   budesonide-formoterol (BREYNA) 80-4.5 MCG/ACT inhaler Inhale 2 puffs into the lungs daily.   cariprazine  (VRAYLAR ) 3 MG capsule Take 1 capsule (3 mg total) by mouth daily. Take one tablet once a day   lamoTRIgine  (LAMICTAL ) 100 MG tablet Take 1 tablet (100 mg total) by mouth daily.  Taking 1 once a day   metroNIDAZOLE  (FLAGYL ) 500 MG tablet Take 1 tablet (500 mg total) by mouth 2 (two) times daily for 7 days.   traZODone  (DESYREL ) 50 MG tablet Take 1 tablet (50 mg total) by mouth at  bedtime.   [DISCONTINUED] pantoprazole  (PROTONIX ) 40 MG tablet Take 1 tablet (40 mg total) by mouth 2 (two) times daily.   No facility-administered medications prior to visit.   "

## 2024-04-17 ENCOUNTER — Ambulatory Visit
Admission: RE | Admit: 2024-04-17 | Discharge: 2024-04-17 | Disposition: A | Source: Ambulatory Visit | Attending: Physician Assistant

## 2024-04-17 DIAGNOSIS — R921 Mammographic calcification found on diagnostic imaging of breast: Secondary | ICD-10-CM

## 2024-04-17 DIAGNOSIS — N6489 Other specified disorders of breast: Secondary | ICD-10-CM

## 2024-04-18 ENCOUNTER — Other Ambulatory Visit: Payer: Self-pay | Admitting: Physician Assistant

## 2024-04-18 ENCOUNTER — Ambulatory Visit: Payer: Self-pay | Admitting: Physician Assistant

## 2024-04-18 DIAGNOSIS — R928 Other abnormal and inconclusive findings on diagnostic imaging of breast: Secondary | ICD-10-CM

## 2024-04-22 ENCOUNTER — Encounter

## 2024-04-22 ENCOUNTER — Inpatient Hospital Stay: Admission: RE | Admit: 2024-04-22 | Source: Ambulatory Visit

## 2024-04-25 ENCOUNTER — Ambulatory Visit: Admit: 2024-04-25

## 2024-04-25 ENCOUNTER — Encounter: Admission: RE | Payer: Self-pay | Source: Home / Self Care

## 2024-05-08 ENCOUNTER — Ambulatory Visit: Admitting: Physician Assistant

## 2024-05-30 ENCOUNTER — Ambulatory Visit: Admitting: Dermatology
